# Patient Record
Sex: Female | Born: 1985 | Race: Black or African American | Hispanic: No | Marital: Single | Smoking: Former smoker
Health system: Southern US, Community
[De-identification: ages and names within clinical notes are randomized; demographics above are authoritative.]

## PROBLEM LIST (undated history)

## (undated) ENCOUNTER — Inpatient Hospital Stay (HOSPITAL_COMMUNITY): Payer: Self-pay

## (undated) DIAGNOSIS — Z8619 Personal history of other infectious and parasitic diseases: Secondary | ICD-10-CM

## (undated) DIAGNOSIS — Z3490 Encounter for supervision of normal pregnancy, unspecified, unspecified trimester: Secondary | ICD-10-CM

## (undated) HISTORY — DX: Encounter for supervision of normal pregnancy, unspecified, unspecified trimester: Z34.90

## (undated) HISTORY — DX: Personal history of other infectious and parasitic diseases: Z86.19

## (undated) HISTORY — PX: CHOLECYSTECTOMY: SHX55

---
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
## 2006-07-19 ENCOUNTER — Emergency Department (HOSPITAL_COMMUNITY): Admission: EM | Admit: 2006-07-19 | Discharge: 2006-07-19 | Payer: Self-pay | Admitting: Emergency Medicine

## 2006-09-12 ENCOUNTER — Emergency Department (HOSPITAL_COMMUNITY): Admission: EM | Admit: 2006-09-12 | Discharge: 2006-09-12 | Payer: Self-pay | Admitting: Emergency Medicine

## 2007-04-12 ENCOUNTER — Other Ambulatory Visit: Admission: RE | Admit: 2007-04-12 | Discharge: 2007-04-12 | Payer: Self-pay | Admitting: Obstetrics and Gynecology

## 2007-05-27 ENCOUNTER — Ambulatory Visit: Payer: Self-pay | Admitting: Obstetrics and Gynecology

## 2007-05-27 ENCOUNTER — Inpatient Hospital Stay (HOSPITAL_COMMUNITY): Admission: AD | Admit: 2007-05-27 | Discharge: 2007-05-29 | Payer: Self-pay | Admitting: Obstetrics and Gynecology

## 2007-11-02 ENCOUNTER — Emergency Department (HOSPITAL_COMMUNITY): Admission: EM | Admit: 2007-11-02 | Discharge: 2007-11-02 | Payer: Self-pay | Admitting: Emergency Medicine

## 2009-09-17 ENCOUNTER — Ambulatory Visit: Payer: Self-pay | Admitting: Advanced Practice Midwife

## 2009-09-17 ENCOUNTER — Inpatient Hospital Stay (HOSPITAL_COMMUNITY): Admission: AD | Admit: 2009-09-17 | Discharge: 2009-09-17 | Payer: Self-pay | Admitting: Obstetrics & Gynecology

## 2009-09-17 IMAGING — US US OB DETAIL+14 WK
1 series · 14 of 28 positions shown · non-contrast
Comparison: none

OBSTETRICAL ULTRASOUND:
 This ultrasound exam was performed in the [HOSPITAL] Ultrasound Department.  The OB US report was generated in the AS system, and faxed to the ordering physician.  This report is also available in [HOSPITAL]?s [employer] and in [REDACTED] PACS.

[Series 1: us ob comp +14 wk · 0.27mm/px · 14 of 64 slices shown]
[im 3/64]
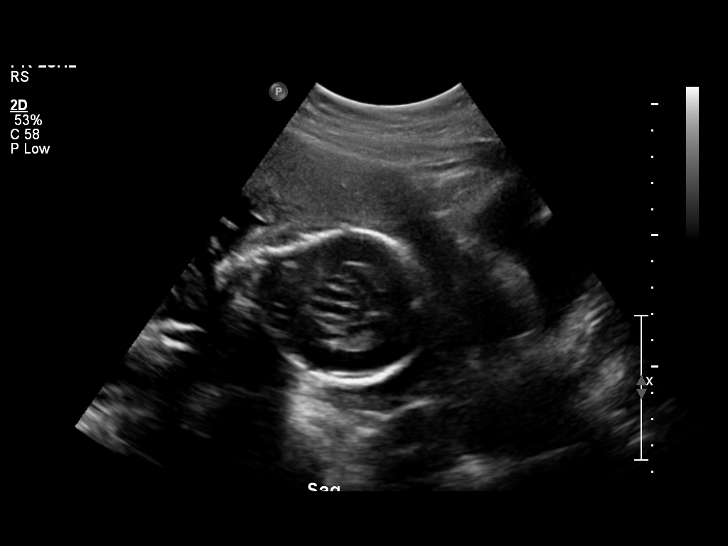
[im 8/64]
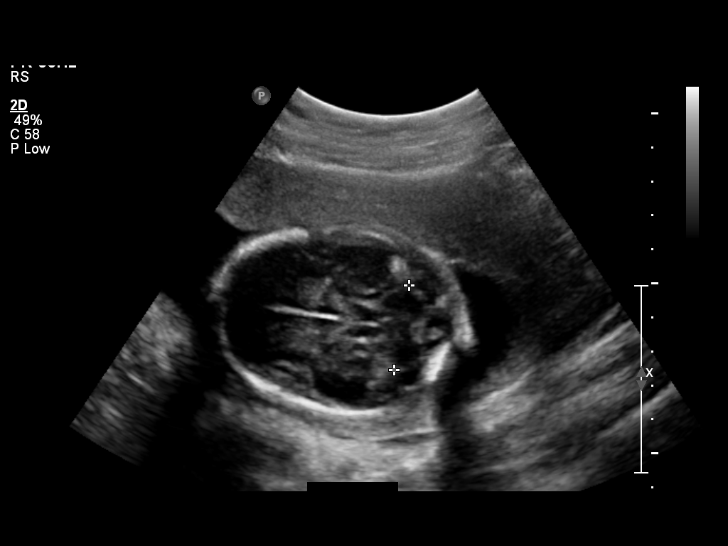
[im 12/64]
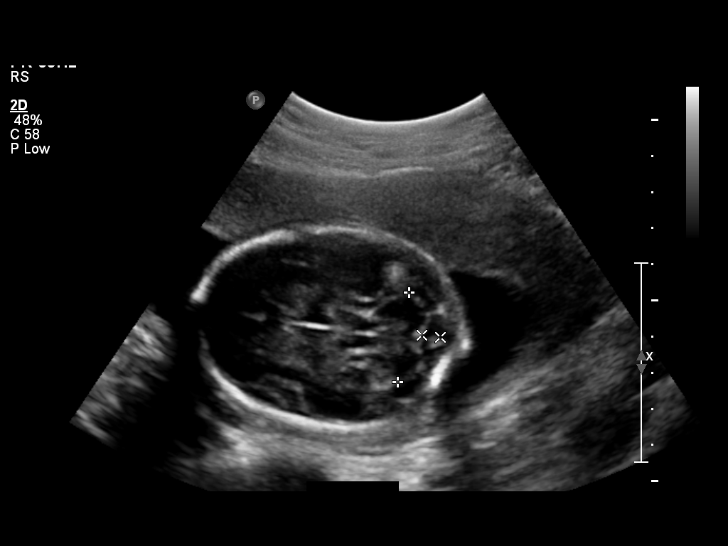
[im 17/64]
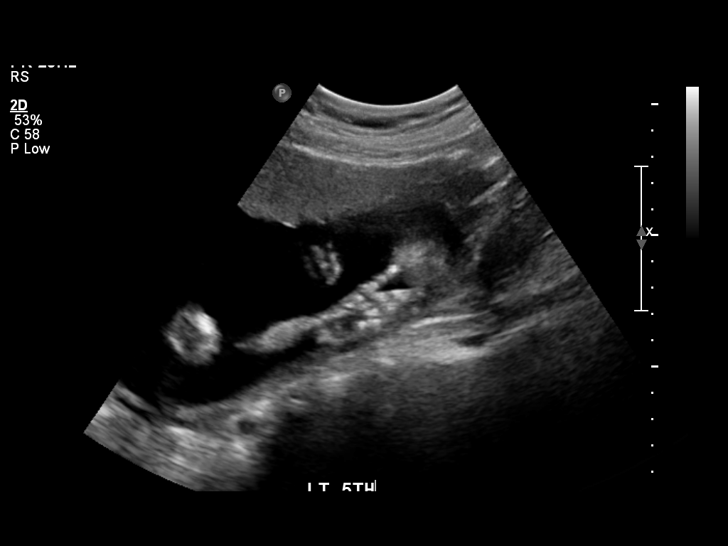
[im 22/64]
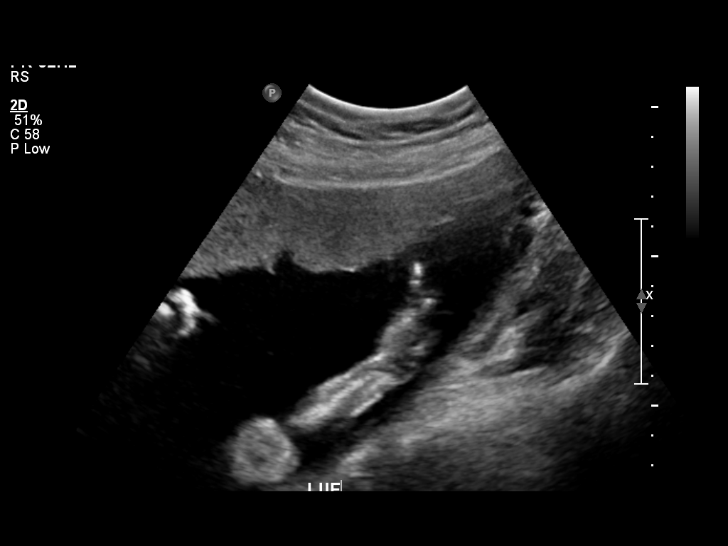
[im 26/64]
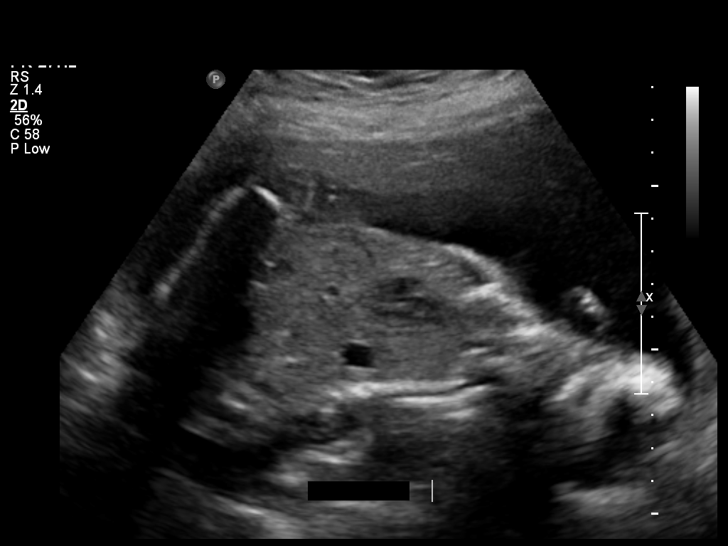
[im 31/64]
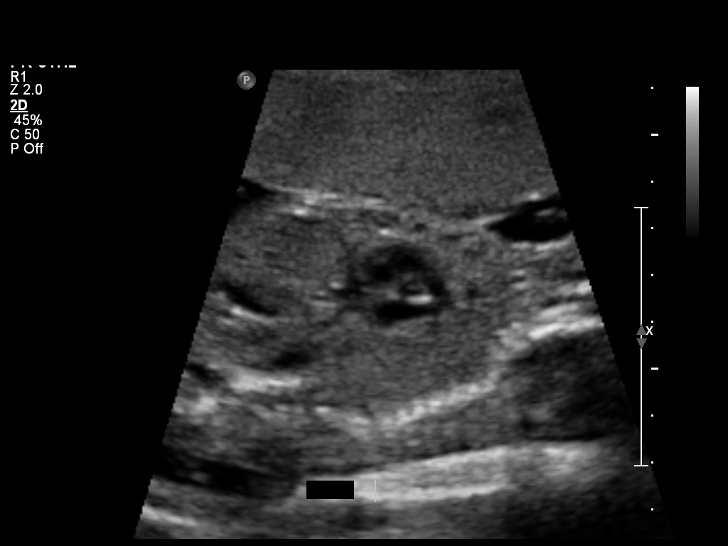
[im 36/64]
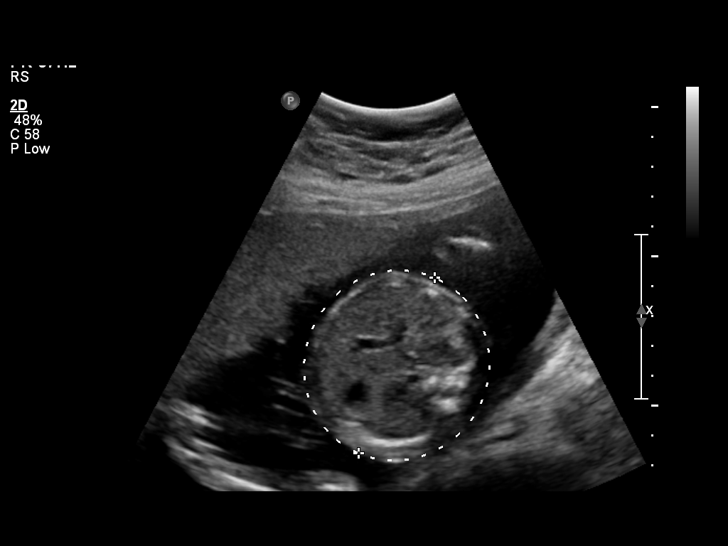
[im 40/64]
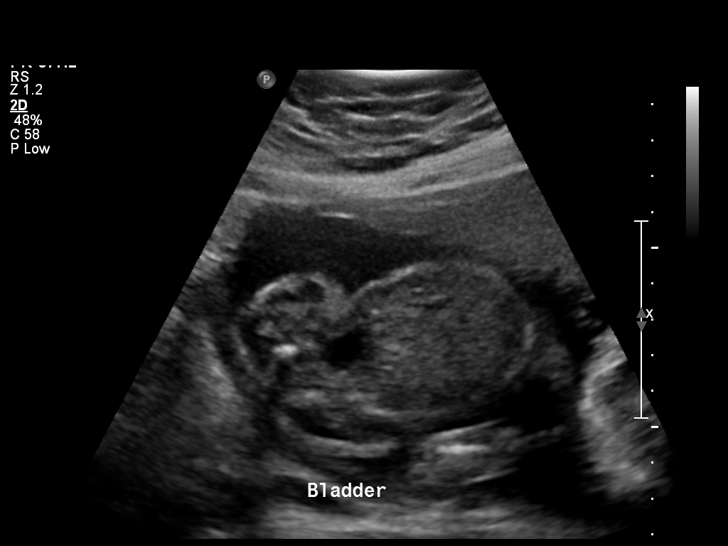
[im 45/64]
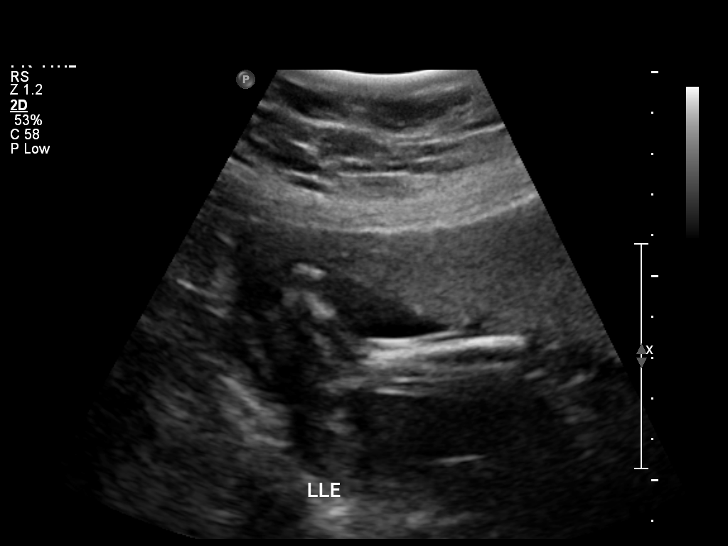
[im 50/64]
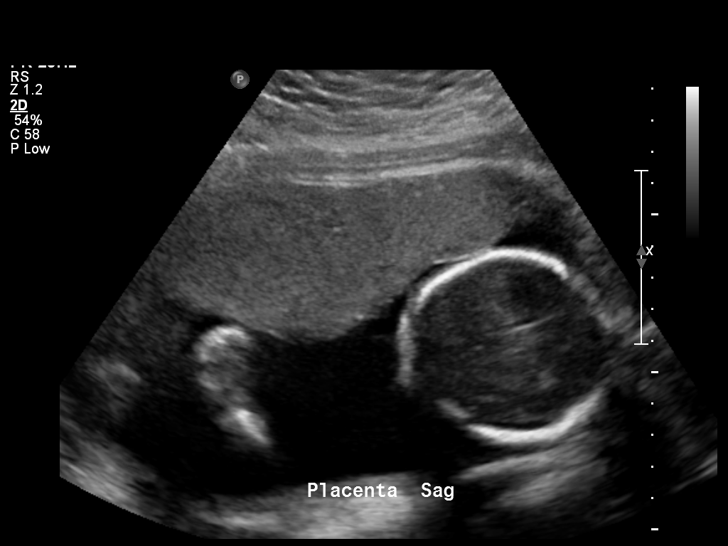
[im 54/64]
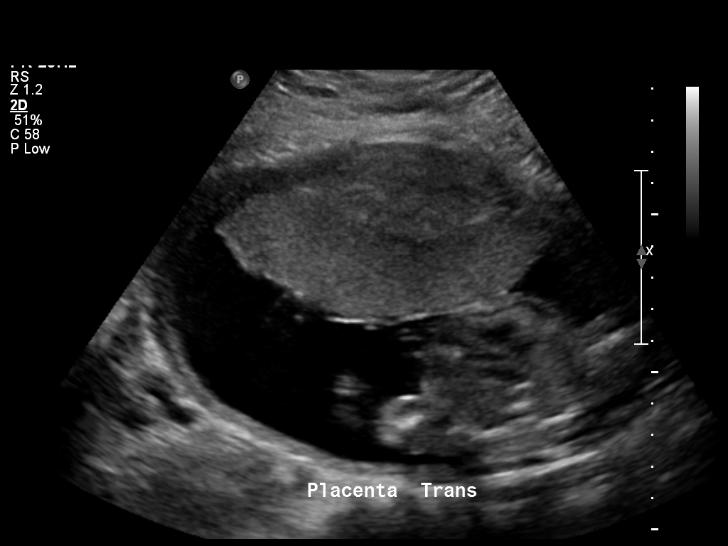
[im 59/64]
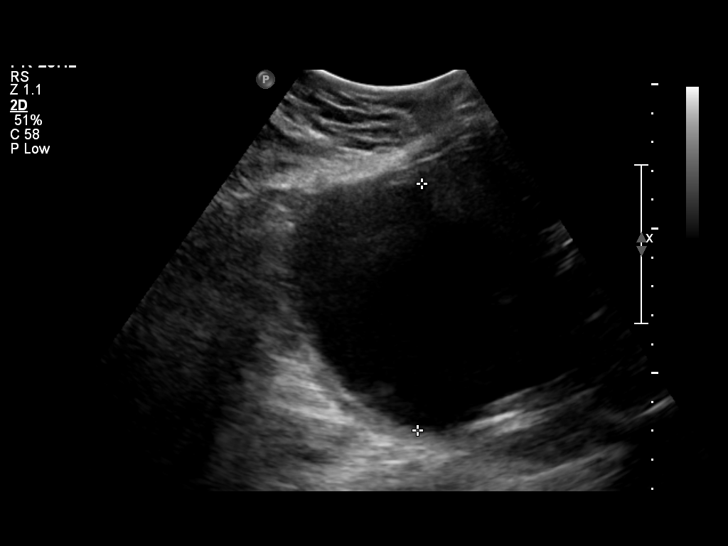
[im 64/64]
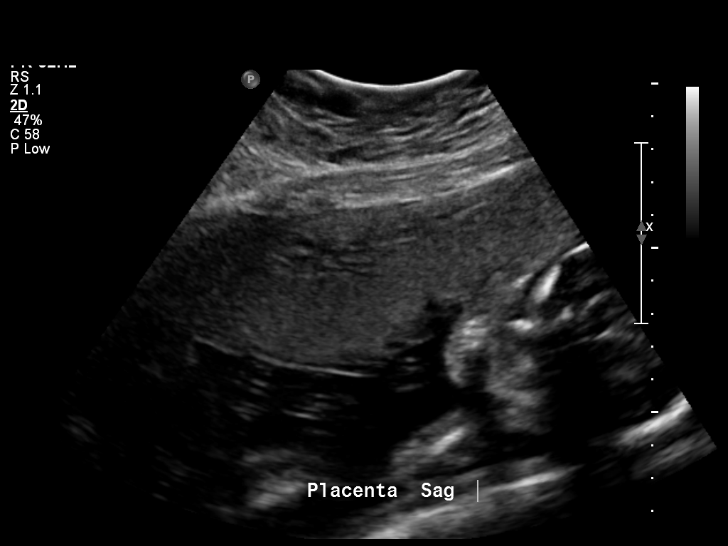

[14 of 28 positions shown; findings below may reference images not displayed]

IMPRESSION: See [facility] Obstetric US report.

## 2010-06-22 ENCOUNTER — Emergency Department (HOSPITAL_COMMUNITY)
Admission: EM | Admit: 2010-06-22 | Discharge: 2010-06-22 | Disposition: A | Discharge status: Home / Self Care | Payer: Self-pay | Attending: Emergency Medicine | Admitting: Emergency Medicine

## 2010-06-22 DIAGNOSIS — Z3201 Encounter for pregnancy test, result positive: Secondary | ICD-10-CM | POA: Insufficient documentation

## 2010-06-22 DIAGNOSIS — M545 Low back pain, unspecified: Secondary | ICD-10-CM | POA: Insufficient documentation

## 2010-06-22 LAB — URINALYSIS, ROUTINE W REFLEX MICROSCOPIC
Bilirubin Urine: NEGATIVE
Ketones, ur: 15 mg/dL — AB
Urine Glucose, Fasting: NEGATIVE mg/dL
Urobilinogen, UA: 0.2 mg/dL (ref 0.0–1.0)
pH: 5.5 (ref 5.0–8.0)

## 2010-06-22 LAB — URINE MICROSCOPIC-ADD ON

## 2010-06-22 LAB — BASIC METABOLIC PANEL
BUN: 6 mg/dL (ref 6–23)
Calcium: 9.2 mg/dL (ref 8.4–10.5)
Creatinine, Ser: 0.53 mg/dL (ref 0.4–1.2)
GFR calc Af Amer: >60 mL/min (ref >60)
Potassium: 4 mEq/L (ref 3.5–5.1)
Sodium: 137 mEq/L (ref 135–145)

## 2010-06-22 LAB — CBC
Platelets: 288 10*3/uL (ref 150–400)
WBC: 8.9 10*3/uL (ref 4.0–10.5)

## 2010-06-22 LAB — DIFFERENTIAL
Lymphocytes Relative: 4 % — ABNORMAL LOW (ref 12–46)
Lymphs Abs: 0.4 10*3/uL — ABNORMAL LOW (ref 0.7–4.0)
Monocytes Absolute: 0.3 10*3/uL (ref 0.1–1.0)
Neutro Abs: 8.2 10*3/uL — ABNORMAL HIGH (ref 1.7–7.7)
Neutrophils Relative %: 92 % — ABNORMAL HIGH (ref 43–77)

## 2010-06-22 LAB — HEPATIC FUNCTION PANEL
ALT: 12 U/L (ref 0–35)
AST: 15 U/L (ref 0–37)
Indirect Bilirubin: 0.4 mg/dL (ref 0.3–0.9)

## 2010-06-22 LAB — POCT PREGNANCY, URINE: Preg Test, Ur: POSITIVE

## 2010-06-22 LAB — HCG, QUANTITATIVE, PREGNANCY: hCG, Beta Chain, Quant, S: 112711 m[IU]/mL — ABNORMAL HIGH (ref <5)

## 2010-06-22 LAB — LIPASE, BLOOD: Lipase: 22 U/L (ref 11–59)

## 2010-07-19 LAB — WET PREP, GENITAL: Yeast Wet Prep HPF POC: NONE SEEN

## 2010-07-19 LAB — GC/CHLAMYDIA PROBE AMP, GENITAL: GC Probe Amp, Genital: POSITIVE — AB

## 2010-07-30 ENCOUNTER — Other Ambulatory Visit: Payer: Self-pay | Admitting: Obstetrics & Gynecology

## 2010-07-30 ENCOUNTER — Other Ambulatory Visit (HOSPITAL_COMMUNITY)
Admission: RE | Admit: 2010-07-30 | Discharge: 2010-07-30 | Disposition: A | Discharge status: Home / Self Care | Payer: Medicaid Other | Source: Ambulatory Visit | Attending: Obstetrics and Gynecology | Admitting: Obstetrics and Gynecology

## 2010-07-30 DIAGNOSIS — R87810 Cervical high risk human papillomavirus (HPV) DNA test positive: Secondary | ICD-10-CM | POA: Insufficient documentation

## 2010-07-30 DIAGNOSIS — Z113 Encounter for screening for infections with a predominantly sexual mode of transmission: Secondary | ICD-10-CM | POA: Insufficient documentation

## 2010-07-30 DIAGNOSIS — Z01419 Encounter for gynecological examination (general) (routine) without abnormal findings: Secondary | ICD-10-CM | POA: Insufficient documentation

## 2010-09-17 NOTE — Discharge Summary (Signed)
NAMEJAKERRA, Annette Zhang                    ACCOUNT NO.:  [id number]   MEDICAL RECORD NO.:  [id number]                   PATIENT TYPE:  OIB   LOCATION:  A428                                 FACILITY:  [facility]   PHYSICIAN:  Langley Gauss, M.D.                DATE OF BIRTH:  03-Apr-1986   DATE OF ADMISSION:  09/13/2003  DATE OF DISCHARGE:  09/14/2003                                 DISCHARGE SUMMARY   The patient was seen for evaluation on both dates of service.   FINAL DIAGNOSES:  1. A 25-week intrauterine pregnancy.  2. Second-trimester bleeding.  3. Right periurethral laceration resulting in #2.   DISPOSITION AT TIME OF DISCHARGE:  The patient is having very minimal active  bleeding. She does have a little bit of pink staining into the toilet with  voiding only. She is having no passage of any clots, but does complain of  some vaginal stinging. She is advised to limit activity as much as possible.  She will continue prenatal care with [facility] and will contact her  office for a sooner visit if necessary for renewed bleeding. The patient  does decline any suturing of this 3 cm long spontaneous laceration at this  time.   CURRENT LABORATORY STUDIES:  Admission hemoglobin and hematocrit 11.1/32.0,  white count 13.9, platelet count 289,000. Fibrinogen 582. D-dimer slightly  elevated at 3.94. AB positive blood type. Negative antibody screen.   HOSPITAL COURSE:  An 25 year old, gravida 1, para 0, [redacted] weeks gestation  presents to [facility] at 21:00 on Sep 13, 2003, with  a chief  complaint of bleeding. The patient denies sexual relations, abdominal pain,  spontaneous rupture of membranes, or any trauma. Specifically, the patient  has had an uncomplicated prenatal course to date. During the afternoon on  Sep 13, 2003, she had some limited activity outdoors with some light-hearted  wrestling with some other children, had also taken her large dogs for some  walks, and  subsequently with voiding the patient states that she had the  onset of some bright red bleeding initially noticed in the toilet water and  was sufficient enough that a small amount did run down her leg. She also  describes the onset of vaginal stinging at that time and the bleeding was  likewise noted when she wiped. She denies any additional leakage of fluid.  She denies any pelvic cramping. Her prenatal course otherwise to date has  been uncomplicated. She is noted to have had an ultrasound performed at  [facility] at 20-weeks gestation. At that time the placenta was  noted to be anterior and mid in position with no low lying or placenta  previa identified.  She is also noted to be RH positive blood type, initial  GC and Chlamydia cultures are negative.   PAST MEDICAL HISTORY:  No significant medical history and no surgical  history. She has no known drug allergies.  CURRENT MEDICATIONS:  CenogenUltra only.   FAMILY HISTORY:  Noncontributory.   SOCIAL HISTORY:  The patient lives with her aunt. The father of the baby is  noted to be supportive. The patient was noted to smoke one-third of a pack  per day of cigarettes. She quit during the pregnancy. She denies any alcohol  or recreational drug usage.   PRENATAL COURSE:  In addition, AFP triple screen was within normal limits.  RPR nonreactive. Pap smear within normal limits.   PERTINENT REVIEW OF SYSTEMS:  See HPI.   PHYSICAL EXAMINATION:  GENERAL: The patient is noted to be in no acute  distress. No evidence of any active bleeding occurring.  VITAL SIGNS: Temperature 98.6, pulse 126, respiratory rate 20, blood  pressure 116/62.  HEENT: Negative. No adenopathy. Mucous membranes are moist.  NECK: Supple.  LUNGS: Clear.  CARDIOVASCULAR: Regular rate and rhythm.  ABDOMEN: Soft and nontender. No surgical scars are identified. A gravid  uterus is identified with a fundal height of 26 cm. No uterine tenderness  elicited.  Normal uterine tone palpable.  EXTREMITIES: Normal.  PELVIC EXAM: Normal external genitalia. No obvious bleeding identified. No  leakage of fluid. No evidence of any vaginal discharge.   A sterile speculum examination is performed. Initially upon separating the  vulva for introital entry, there is noted to be a 3-cm long, though  superficial right periurethral laceration with a small clot adherent, and  obvious evidence of active bleeding with gentle separation of the labia that  like separates the mucosal edges of the laceration. Beyond this, the sterile  speculum examination reveals the cervix to be closed. There is no cervical  bleeding. No blood within the vaginal vault. The remainder of the vaginal  vault and vulva within normal limits.   ADDITIONAL STUDIES:  A nonstress test is in progress with the indication of  second trimester vaginal bleeding, fetal  heart tone baseline 150, no fetal  heart rate accelerations or decelerations noted with long-term variability  for [redacted] weeks gestation noted to be appropriate and normal, thus a  nonreactive nonstress test, but reassuring considering the gestational age.   HOSPITAL COURSE:  The patient was observed throughout the evening and  through the next day. She had normoactive bleeding, no bleeding into the  undergarments. With voiding she does have a small amount of blood going into  the toilet water standing in a very light red color. Again, she denies any  significant uterine cramping, denies pelvic pressure, and denies any change  at all in her vaginal discharge.   FINAL DIAGNOSIS:  Second-trimester vaginal bleeding secondary to spontaneous  right periurethral laceration. Again, the patient declined suturing of this  area at this time. She is, however, advised that should this continue to be  an ongoing problem she may be best served by proceeding with closure, if secondary closure does not spontaneously occur.      ___________________________________________                                         Langley Gauss, M.D.   DC/MEDQ  D:  09/14/2003  T:  09/14/2003  Job:  [id number]

## 2010-12-02 ENCOUNTER — Encounter: Payer: Self-pay | Admitting: *Deleted

## 2010-12-02 ENCOUNTER — Emergency Department (HOSPITAL_COMMUNITY)
Admission: EM | Admit: 2010-12-02 | Discharge: 2010-12-02 | Disposition: A | Discharge status: Home / Self Care | Payer: Medicaid Other | Attending: Emergency Medicine | Admitting: Emergency Medicine

## 2010-12-02 DIAGNOSIS — O99891 Other specified diseases and conditions complicating pregnancy: Secondary | ICD-10-CM | POA: Insufficient documentation

## 2010-12-02 DIAGNOSIS — N898 Other specified noninflammatory disorders of vagina: Secondary | ICD-10-CM | POA: Insufficient documentation

## 2010-12-02 NOTE — ED Notes (Signed)
Pt stated she is [redacted] weeks pregnant, felt fluid coming out of vagina while sitting on sofa apporx 15 min pta.  Was clear fluid.  5th pregnancy.  Denies any pain/contractions, is followed by dr. Emelda Fear.   fht's 168, denies any urinary s/s

## 2010-12-02 NOTE — ED Provider Notes (Signed)
History    Scribed for Joya Gaskins, MD, the patient was seen in room [facility]01/[facility]. This chart was scribed by Clarita Crane. This patient's care was started at 9:47PM.  CSN: [id number] Arrival date & time: 12/02/2010  9:34 PM  Chief Complaint  Patient presents with  . Vaginal Discharge   HPI Patient is a 25 year old female currently 33.[redacted] weeks pregnant, G-5 P-4 Ab-0, c/o moderate amount of clear vaginal d/c which "gushed out" from vagina with associated vaginal pressure approximately 30 minutes ago while sitting in a chair. Denies vaginal bleeding, abdominal pain, contractions, HA, dizziness, swelling of extremities, chest pain, dysuria. Patient reports she has had no complications with current pregnancy or previous pregnancies. Notes she can feel fetal movement currently.   PAST MEDICAL HISTORY:  Past Medical History  Diagnosis Date  . Fifth pregnancy     PAST SURGICAL HISTORY:  History reviewed. No pertinent past surgical history.  MEDICATIONS:  Previous Medications   No medications on file     ALLERGIES:  Allergies as of 12/02/2010  . (No Known Allergies)     FAMILY HISTORY:  History reviewed. No pertinent family history.   SOCIAL HISTORY: History   Social History  . Marital Status: Single    Spouse Name: N/A    Number of Children: N/A  . Years of Education: N/A   Social History Main Topics  . Smoking status: Never Smoker   . Smokeless tobacco: None  . Alcohol Use: No  . Drug Use: No  . Sexually Active:    Other Topics Concern  . None   Social History Narrative  . None     OB History    Grav Para Term Preterm Abortions TAB SAB Ect Mult Living   5 4 4              Review of Systems 10 Systems reviewed and are negative for acute change except as noted in the HPI.  Physical Exam  Ht 5\' 4"  (1.626 m)  Wt 263 lb (119.296 kg)  BMI 45.14 kg/m2  Physical Exam CONSTITUTIONAL: Well developed/well nourished HEAD AND FACE:  Normocephalic/atraumatic EYES: EOMI/PERRL ENMT: Mucous membranes moist NECK: supple no meningeal signs CV: S1/S2 noted, no murmurs/rubs/gallops noted LUNGS: Lungs are clear to auscultation bilaterally, no apparent distress ABDOMEN: soft, nontender, no rebound or guarding, gravid, size c/w 33.5 weeks GU: Pelvic exam-Sterile gloves used, female chaperone present, no vag bleeding, whitish vag d/c with no obvious pooling of fluid, cervix fingertip dilated NEURO: Pt is awake/alert, moves all extremitiesx4 EXTREMITIES: pulses normal, full ROM SKIN: warm, color normal PSYCH: no abnormalities of mood noted  ED Course  Procedures  OTHER DATA REVIEWED: Nursing notes, vital signs reviewed.   BP 108/65  Pulse 117  Temp(Src) 97.5 F (36.4 C) (Oral)  Resp 20  Ht 5\' 4"  (1.626 m)  Wt 263 lb (119.296 kg)  BMI 45.14 kg/m2  SpO2 98%    LABS / RADIOLOGY:  No results found.   PROCEDURES:  ED COURSE / COORDINATION OF CARE: 9:49PM- Patient currently on fetal monitor. Pelvic exam recommended to patient and consent provided by patient to have exam performed. 9:50PM- Pelvic exam performed  9:55Pm-Fetal heart tones in 140 consistently, Patient BP-108/55 10:00PM- Consult completed with Dr. Emelda Fear. Patient's case discussed and Dr. Emelda Fear agrees to evaluated patient in ED.   Seen by ferguson.  He performed exam, and she is fern negative.  No evidence of ROM.  Pt stable for d/c after seen by dr Emelda Fear  CONDITION ON DISCHARGE: stable   MEDICATIONS GIVEN IN THE E.D. Medications - No data to display    I personally performed the services described in this documentation, which was scribed in my presence. The recorded information has been reviewed and considered. Joya Gaskins, MD   Joya Gaskins, MD 12/02/10 [phone]

## 2010-12-02 NOTE — Progress Notes (Signed)
12/02/10 2156  Provider Notification  Provider Name/Title Other (comment) (Dr Bebe Shaggy, [facility] EDP)  Method of Notification Phone  Request Evaluate - remote  Notification Reason Other (Comment)   spoke to dr Bebe Shaggy and [facility] at [facility] ED, pt is 33.4 c/o white liquid discharge approx 1 hour ago. FHR tracing reassuring, pt is G5P4 seen at [facility]. EDP to call on call for [facility]. Pt in no acute distress per report on phine

## 2010-12-02 NOTE — ED Notes (Signed)
Clear vaginal discharge that stared today, pt states that she noticed the discharge when she was sitting in a chair, states that she felt "something gush out", pt admits to vaginal pressure, pt states that she will be [redacted] weeks pregnant Sunday, due date 01/16/2011, pt's fifth pregnancy, pt states that the pregnancy is normal,  Pt states that she was induced with two pregnancies due to being term, the other two pregnancies were normal deliveries at 9 months,

## 2010-12-02 NOTE — ED Notes (Signed)
Dr.ferguson in to exam pt, pt laughing and smiling at times, slide obtained by dr.ferguson. To lab to exam under microscope.  Pt tolerated well.  Pt requested that brother be allowed into exam room while she is dressing. Brother to bedside. Per pt request.  She was removed from all monitors

## 2011-01-21 LAB — RAPID URINE DRUG SCREEN, HOSP PERFORMED
Barbiturates: NOT DETECTED
Benzodiazepines: NOT DETECTED
Cocaine: NOT DETECTED
Opiates: NOT DETECTED
Tetrahydrocannabinol: NOT DETECTED

## 2011-01-21 LAB — CBC
HCT: 27.1 — ABNORMAL LOW
Hemoglobin: 9.1 — ABNORMAL LOW
MCV: 83.1
WBC: 11.8 — ABNORMAL HIGH

## 2011-01-21 LAB — RPR: RPR Ser Ql: NONREACTIVE

## 2012-10-18 ENCOUNTER — Other Ambulatory Visit: Payer: Self-pay | Admitting: Obstetrics & Gynecology

## 2012-10-18 DIAGNOSIS — O3680X0 Pregnancy with inconclusive fetal viability, not applicable or unspecified: Secondary | ICD-10-CM

## 2012-10-29 ENCOUNTER — Other Ambulatory Visit: Payer: Self-pay

## 2012-10-29 ENCOUNTER — Encounter (HOSPITAL_COMMUNITY): Payer: Self-pay | Admitting: *Deleted

## 2012-10-29 ENCOUNTER — Encounter: Payer: Self-pay | Admitting: *Deleted

## 2012-10-29 ENCOUNTER — Emergency Department (HOSPITAL_COMMUNITY)
Admission: EM | Admit: 2012-10-29 | Discharge: 2012-10-29 | Discharge status: Against Medical Advice | Payer: Self-pay | Attending: Emergency Medicine | Admitting: Emergency Medicine

## 2012-10-29 DIAGNOSIS — R4020 Unspecified coma: Secondary | ICD-10-CM

## 2012-10-29 DIAGNOSIS — O9989 Other specified diseases and conditions complicating pregnancy, childbirth and the puerperium: Secondary | ICD-10-CM | POA: Insufficient documentation

## 2012-10-29 DIAGNOSIS — R55 Syncope and collapse: Secondary | ICD-10-CM | POA: Insufficient documentation

## 2012-10-29 DIAGNOSIS — R404 Transient alteration of awareness: Secondary | ICD-10-CM | POA: Insufficient documentation

## 2012-10-29 LAB — URINALYSIS, ROUTINE W REFLEX MICROSCOPIC
Hgb urine dipstick: NEGATIVE
Ketones, ur: NEGATIVE mg/dL
Nitrite: NEGATIVE
Protein, ur: NEGATIVE mg/dL
Specific Gravity, Urine: 1.02 (ref 1.005–1.030)

## 2012-10-29 LAB — POCT PREGNANCY, URINE: Preg Test, Ur: POSITIVE — AB

## 2012-10-29 NOTE — ED Notes (Signed)
Pt left before being seen and after triage. Pt expressed her concerns of being pregnant while fiance is deployed [place] seas as a marine. Pt is visiting her father here from [place].

## 2012-10-29 NOTE — ED Notes (Addendum)
Pt recently moved here from [place],  Having episodes of syncope, since 6/8 and having vomiting and hot flashes.  Alert, talking.  Pt took preg test at home 1 week ago that was negative.

## 2012-10-29 NOTE — ED Notes (Signed)
Pt ambulating to bathroom without assistance

## 2012-10-30 ENCOUNTER — Encounter: Payer: Self-pay | Admitting: *Deleted

## 2012-10-30 ENCOUNTER — Other Ambulatory Visit: Payer: Self-pay

## 2012-11-08 ENCOUNTER — Ambulatory Visit (INDEPENDENT_AMBULATORY_CARE_PROVIDER_SITE_OTHER): Payer: Medicaid Other

## 2012-11-08 DIAGNOSIS — O3680X0 Pregnancy with inconclusive fetal viability, not applicable or unspecified: Secondary | ICD-10-CM

## 2012-11-08 NOTE — Progress Notes (Signed)
U/S (12+2wks)-single IUP with +FCA noted, CRL c/w LMP dates, cx long and closed, bilateral adnexa WNL

## 2012-11-21 ENCOUNTER — Other Ambulatory Visit: Payer: Self-pay

## 2013-01-01 ENCOUNTER — Encounter: Payer: Medicaid Other | Admitting: Obstetrics & Gynecology

## 2013-01-10 ENCOUNTER — Encounter: Payer: Self-pay | Admitting: Adult Health

## 2013-01-10 ENCOUNTER — Other Ambulatory Visit: Payer: Self-pay | Admitting: Adult Health

## 2013-01-10 ENCOUNTER — Ambulatory Visit (INDEPENDENT_AMBULATORY_CARE_PROVIDER_SITE_OTHER): Payer: Medicaid Other | Admitting: Adult Health

## 2013-01-10 VITALS — BP 120/76 | Wt 265.0 lb

## 2013-01-10 DIAGNOSIS — Z331 Pregnant state, incidental: Secondary | ICD-10-CM

## 2013-01-10 DIAGNOSIS — O0930 Supervision of pregnancy with insufficient antenatal care, unspecified trimester: Secondary | ICD-10-CM

## 2013-01-10 DIAGNOSIS — Z1389 Encounter for screening for other disorder: Secondary | ICD-10-CM

## 2013-01-10 DIAGNOSIS — Z3490 Encounter for supervision of normal pregnancy, unspecified, unspecified trimester: Secondary | ICD-10-CM

## 2013-01-10 DIAGNOSIS — Z3482 Encounter for supervision of other normal pregnancy, second trimester: Secondary | ICD-10-CM

## 2013-01-10 HISTORY — DX: Encounter for supervision of normal pregnancy, unspecified, unspecified trimester: Z34.90

## 2013-01-10 LAB — POCT URINALYSIS DIPSTICK: Nitrite, UA: NEGATIVE

## 2013-01-10 LAB — CBC
HCT: 32.3 % — ABNORMAL LOW (ref 36.0–46.0)
Hemoglobin: 10.7 g/dL — ABNORMAL LOW (ref 12.0–15.0)
MCH: 30.1 pg (ref 26.0–34.0)
MCHC: 33.1 g/dL (ref 30.0–36.0)
RDW: 13.2 % (ref 11.5–15.5)

## 2013-01-10 LAB — URINALYSIS
Bilirubin Urine: NEGATIVE
Leukocytes, UA: NEGATIVE
Nitrite: NEGATIVE
Protein, ur: NEGATIVE mg/dL
Specific Gravity, Urine: 1.018 (ref 1.005–1.030)
Urobilinogen, UA: 1 mg/dL (ref 0.0–1.0)

## 2013-01-10 MED ORDER — PRENATAL PLUS 27-1 MG PO TABS: 1.0000 | ORAL_TABLET | Freq: Every day | ORAL | Status: AC

## 2013-01-10 NOTE — Progress Notes (Signed)
Pt here today for New OB appointment. Pt given CCNC form and lab consent to read and sign. Pt denies any problems or concerns at this time.

## 2013-01-10 NOTE — Patient Instructions (Addendum)
Pregnancy - Second Trimester The second trimester of pregnancy (3 to 6 months) is a period of rapid growth for you and your baby. At the end of the sixth month, your baby is about 9 inches long and weighs 1 1/2 pounds. You will begin to feel the baby move between 18 and 20 weeks of the pregnancy. This is called quickening. Weight gain is faster. A clear fluid (colostrum) may leak out of your breasts. You may feel small contractions of the womb (uterus). This is known as false labor or Braxton-Hicks contractions. This is like a practice for labor when the baby is ready to be born. Usually, the problems with morning sickness have usually passed by the end of your first trimester. Some women develop small dark blotches (called cholasma, mask of pregnancy) on their face that usually goes away after the baby is born. Exposure to the sun makes the blotches worse. Acne may also develop in some pregnant women and pregnant women who have acne, may find that it goes away. PRENATAL EXAMS  Blood work may continue to be done during prenatal exams. These tests are done to check on your health and the probable health of your baby. Blood work is used to follow your blood levels (hemoglobin). Anemia (low hemoglobin) is common during pregnancy. Iron and vitamins are given to help prevent this. You will also be checked for diabetes between 24 and 28 weeks of the pregnancy. Some of the previous blood tests may be repeated.  The size of the uterus is measured during each visit. This is to make sure that the baby is continuing to grow properly according to the dates of the pregnancy.  Your blood pressure is checked every prenatal visit. This is to make sure you are not getting toxemia.  Your urine is checked to make sure you do not have an infection, diabetes or protein in the urine.  Your weight is checked often to make sure gains are happening at the suggested rate. This is to ensure that both you and your baby are growing  normally.  Sometimes, an ultrasound is performed to confirm the proper growth and development of the baby. This is a test which bounces harmless sound waves off the baby so your caregiver can more accurately determine due dates. Sometimes, a test is done on the amniotic fluid surrounding the baby. This test is called an amniocentesis. The amniotic fluid is obtained by sticking a needle into the belly (abdomen). This is done to check the chromosomes in instances where there is a concern about possible genetic problems with the baby. It is also sometimes done near the end of pregnancy if an early delivery is required. In this case, it is done to help make sure the baby's lungs are mature enough for the baby to live outside of the womb. CHANGES OCCURING IN THE SECOND TRIMESTER OF PREGNANCY Your body goes through many changes during pregnancy. They vary from person to person. Talk to your caregiver about changes you notice that you are concerned about.  During the second trimester, you will likely have an increase in your appetite. It is normal to have cravings for certain foods. This varies from person to person and pregnancy to pregnancy.  Your lower abdomen will begin to bulge.  You may have to urinate more often because the uterus and baby are pressing on your bladder. It is also common to get more bladder infections during pregnancy. You can help this by drinking lots of fluids  and emptying your bladder before and after intercourse.  You may begin to get stretch marks on your hips, abdomen, and breasts. These are normal changes in the body during pregnancy. There are no exercises or medicines to take that prevent this change.  You may begin to develop swollen and bulging veins (varicose veins) in your legs. Wearing support hose, elevating your feet for 15 minutes, 3 to 4 times a day and limiting salt in your diet helps lessen the problem.  Heartburn may develop as the uterus grows and pushes up  against the stomach. Antacids recommended by your caregiver helps with this problem. Also, eating smaller meals 4 to 5 times a day helps.  Constipation can be treated with a stool softener or adding bulk to your diet. Drinking lots of fluids, and eating vegetables, fruits, and whole grains are helpful.  Exercising is also helpful. If you have been very active up until your pregnancy, most of these activities can be continued during your pregnancy. If you have been less active, it is helpful to start an exercise program such as walking.  Hemorrhoids may develop at the end of the second trimester. Warm sitz baths and hemorrhoid cream recommended by your caregiver helps hemorrhoid problems.  Backaches may develop during this time of your pregnancy. Avoid heavy lifting, wear low heal shoes, and practice good posture to help with backache problems.  Some pregnant women develop tingling and numbness of their hand and fingers because of swelling and tightening of ligaments in the wrist (carpel tunnel syndrome). This goes away after the baby is born.  As your breasts enlarge, you may have to get a bigger bra. Get a comfortable, cotton, support bra. Do not get a nursing bra until the last month of the pregnancy if you will be nursing the baby.  You may get a dark line from your belly button to the pubic area called the linea nigra.  You may develop rosy cheeks because of increase blood flow to the face.  You may develop spider looking lines of the face, neck, arms, and chest. These go away after the baby is born. HOME CARE INSTRUCTIONS   It is extremely important to avoid all smoking, herbs, alcohol, and unprescribed drugs during your pregnancy. These chemicals affect the formation and growth of the baby. Avoid these chemicals throughout the pregnancy to ensure the delivery of a healthy infant.  Most of your home care instructions are the same as suggested for the first trimester of your pregnancy.  Keep your caregiver's appointments. Follow your caregiver's instructions regarding medicine use, exercise, and diet.  During pregnancy, you are providing food for you and your baby. Continue to eat regular, well-balanced meals. Choose foods such as meat, fish, milk and other low fat dairy products, vegetables, fruits, and whole-grain breads and cereals. Your caregiver will tell you of the ideal weight gain.  A physical sexual relationship may be continued up until near the end of pregnancy if there are no other problems. Problems could include early (premature) leaking of amniotic fluid from the membranes, vaginal bleeding, abdominal pain, or other medical or pregnancy problems.  Exercise regularly if there are no restrictions. Check with your caregiver if you are unsure of the safety of some of your exercises. The greatest weight gain will occur in the last 2 trimesters of pregnancy. Exercise will help you:  Control your weight.  Get you in shape for labor and delivery.  Lose weight after you have the baby.  Wear  a good support or jogging bra for breast tenderness during pregnancy. This may help if worn during sleep. Pads or tissues may be used in the bra if you are leaking colostrum.  Do not use hot tubs, steam rooms or saunas throughout the pregnancy.  Wear your seat belt at all times when driving. This protects you and your baby if you are in an accident.  Avoid raw meat, uncooked cheese, cat litter boxes, and soil used by cats. These carry germs that can cause birth defects in the baby.  The second trimester is also a good time to visit your dentist for your dental health if this has not been done yet. Getting your teeth cleaned is okay. Use a soft toothbrush. Brush gently during pregnancy.  It is easier to leak urine during pregnancy. Tightening up and strengthening the pelvic muscles will help with this problem. Practice stopping your urination while you are going to the bathroom.  These are the same muscles you need to strengthen. It is also the muscles you would use as if you were trying to stop from passing gas. You can practice tightening these muscles up 10 times a set and repeating this about 3 times per day. Once you know what muscles to tighten up, do not perform these exercises during urination. It is more likely to contribute to an infection by backing up the urine.  Ask for help if you have financial, counseling, or nutritional needs during pregnancy. Your caregiver will be able to offer counseling for these needs as well as refer you for other special needs.  Your skin may become oily. If so, wash your face with mild soap, use non-greasy moisturizer and oil or cream based makeup. MEDICINES AND DRUG USE IN PREGNANCY  Take prenatal vitamins as directed. The vitamin should contain 1 milligram of folic acid. Keep all vitamins out of reach of children. Only a couple vitamins or tablets containing iron may be fatal to a baby or young child when ingested.  Avoid use of all medicines, including herbs, over-the-counter medicines, not prescribed or suggested by your caregiver. Only take over-the-counter or prescription medicines for pain, discomfort, or fever as directed by your caregiver. Do not use aspirin.  Let your caregiver also know about herbs you may be using.  Alcohol is related to a number of birth defects. This includes fetal alcohol syndrome. All alcohol, in any form, should be avoided completely. Smoking will cause low birth rate and premature babies.  Street or illegal drugs are very harmful to the baby. They are absolutely forbidden. A baby born to an addicted mother will be addicted at birth. The baby will go through the same withdrawal an adult does. SEEK MEDICAL CARE IF:  You have any concerns or worries during your pregnancy. It is better to call with your questions if you feel they cannot wait, rather than worry about them. SEEK IMMEDIATE MEDICAL CARE  IF:   An unexplained oral temperature above 102 F (38.9 C) develops, or as your caregiver suggests.  You have leaking of fluid from the vagina (birth canal). If leaking membranes are suspected, take your temperature and tell your caregiver of this when you call.  There is vaginal spotting, bleeding, or passing clots. Tell your caregiver of the amount and how many pads are used. Light spotting in pregnancy is common, especially following intercourse.  You develop a bad smelling vaginal discharge with a change in the color from clear to white.  You continue to feel  sick to your stomach (nauseated) and have no relief from remedies suggested. You vomit blood or coffee ground-like materials.  You lose more than 2 pounds of weight or gain more than 2 pounds of weight over 1 week, or as suggested by your caregiver.  You notice swelling of your face, hands, feet, or legs.  You get exposed to [place] measles and have never had them.  You are exposed to fifth disease or chickenpox.  You develop belly (abdominal) pain. Round ligament discomfort is a common non-cancerous (benign) cause of abdominal pain in pregnancy. Your caregiver still must evaluate you.  You develop a bad headache that does not go away.  You develop fever, diarrhea, pain with urination, or shortness of breath.  You develop visual problems, blurry, or double vision.  You fall or are in a car accident or any kind of trauma.  There is mental or physical violence at home. Document Released: 04/12/2001 Document Revised: 01/11/2012 Document Reviewed: 10/15/2008 [facility] Patient Information 2014 [place], [place]. Return in 1 week

## 2013-01-10 NOTE — Progress Notes (Signed)
  Subjective:    Annette Zhang is a 27 y.o. G27P0003 African American female at [redacted]w[redacted]d by LMP being seen today for her first obstetrical visit.  Her obstetrical history is significant for late presentation and putting bab up for adoption.  Pregnancy history fully reviewed.   Patient reports no complaints.  Filed Vitals:   01/10/13 1128  BP: 120/76  Weight: 265 lb (120.203 kg)    HISTORY: OB History  Gravida Para Term Preterm AB SAB TAB Ectopic Multiple Living  7 6 0      1 3    # Outcome Date GA Lbr Len/2nd Weight Sex Delivery Anes PTL Lv  7 CUR           6 PAR 2012    F SVD   Y  5 PAR 2011    M SVD     4 PAR 2009    M SVD     3A PAR 2008    M SVD     3B  2008    M      2 PAR 2007    M SVD   Y  1 PAR 2005    F SVD   Y     Past Medical History  Diagnosis Date  . Hx of chlamydia infection   . Pregnant 01/10/2013    To adopt baby out   Past Surgical History  Procedure Laterality Date  . Cholecystectomy     Family History  Problem Relation Age of Onset  . Cancer Maternal Aunt     breast  . Cancer Maternal Grandmother     breast and cervical     Exam    Pelvic Exam:    Perineum: deferred   Vulva: deferred   Vagina:  deferred   Uterus   21 week size     Cervix: deferred   Adnexa: Not palpable   Urinary: deferred    System:     Skin: normal coloration and turgor, no rashes    Neurologic: oriented, normal mood   Extremities: normal strength, tone, and muscle mass   HEENT PERRLA   Mouth/Teeth mucous membranes moist   Cardiovascular: regular rate and rhythm   Respiratory:  appears well, vitals normal, no respiratory distress, acyanotic, normal RR   Abdomen: soft, non-tender       Assessment:    Pregnancy: G7P0003 Patient Active Problem List   Diagnosis Date Noted  . Pregnant 01/10/2013      [redacted]w[redacted]d G7P0003 New OB visit    Plan:     Initial labs drawn Continue prenatal vitamins Problem list reviewed and updated Reviewed n/v relief  measures and warning s/s to report Reviewed recommended weight gain based on pre-gravid BMI Encouraged well-balanced diet Genetic Screening discussed Quad Screen: requested Cystic fibrosis screening discussed requested Ultrasound discussed; fetal survey: requested Follow up in 1 weeks for anatomy scan and see Dr Despina Hidden rx prenatal plus 1 daily with 11 refills GRIFFIN,JENNIFER 01/10/2013 12:10 PM

## 2013-01-11 LAB — HEPATITIS B SURFACE ANTIGEN: Hepatitis B Surface Ag: NEGATIVE

## 2013-01-11 LAB — AFP, QUAD SCREEN
Age Alone: 1:885 {titer}
Curr Gest Age: 21.2 wks.days
MoM for AFP: 1.25
MoM for hCG: 0.58
Tri 18 Scr Risk Est: NEGATIVE
Trisomy 18 (Edward) Syndrome Interp.: 1:24800 {titer}
uE3 Value: 1.3 ng/mL

## 2013-01-11 LAB — DRUG SCREEN, URINE, NO CONFIRMATION
Amphetamine Screen, Ur: NEGATIVE
Barbiturate Quant, Ur: NEGATIVE
Cocaine Metabolites: NEGATIVE
Marijuana Metabolite: NEGATIVE
Methadone: NEGATIVE
Opiate Screen, Urine: NEGATIVE

## 2013-01-11 LAB — ABO AND RH

## 2013-01-11 LAB — GC/CHLAMYDIA PROBE AMP: CT Probe RNA: NEGATIVE

## 2013-01-11 LAB — HIV ANTIBODY (ROUTINE TESTING W REFLEX): HIV: NONREACTIVE

## 2013-01-11 LAB — TSH: TSH: 2.409 u[IU]/mL (ref 0.350–4.500)

## 2013-01-11 LAB — CYSTIC FIBROSIS DIAGNOSTIC STUDY

## 2013-01-11 LAB — SICKLE CELL SCREEN: Sickle Cell Screen: NEGATIVE

## 2013-01-12 LAB — URINE CULTURE
Colony Count: NO GROWTH
Organism ID, Bacteria: NO GROWTH

## 2013-01-17 ENCOUNTER — Other Ambulatory Visit: Payer: Medicaid Other

## 2013-01-17 ENCOUNTER — Encounter: Payer: Medicaid Other | Admitting: Obstetrics & Gynecology

## 2013-01-22 ENCOUNTER — Encounter: Payer: Medicaid Other | Admitting: Obstetrics & Gynecology

## 2013-01-22 ENCOUNTER — Other Ambulatory Visit: Payer: Medicaid Other

## 2013-01-28 ENCOUNTER — Encounter: Payer: Medicaid Other | Admitting: Obstetrics & Gynecology

## 2013-01-28 ENCOUNTER — Other Ambulatory Visit: Payer: Medicaid Other

## 2013-02-01 ENCOUNTER — Other Ambulatory Visit: Payer: Medicaid Other

## 2013-03-16 ENCOUNTER — Encounter (HOSPITAL_COMMUNITY): Payer: Self-pay | Admitting: Emergency Medicine

## 2013-03-16 ENCOUNTER — Inpatient Hospital Stay (HOSPITAL_COMMUNITY)
Admission: EM | Admit: 2013-03-16 | Discharge: 2013-03-17 | Disposition: A | Discharge status: Home / Self Care | Payer: Medicaid Other | Attending: Obstetrics and Gynecology | Admitting: Obstetrics and Gynecology

## 2013-03-16 DIAGNOSIS — O99330 Smoking (tobacco) complicating pregnancy, unspecified trimester: Secondary | ICD-10-CM | POA: Insufficient documentation

## 2013-03-16 DIAGNOSIS — A499 Bacterial infection, unspecified: Secondary | ICD-10-CM | POA: Insufficient documentation

## 2013-03-16 DIAGNOSIS — R109 Unspecified abdominal pain: Secondary | ICD-10-CM | POA: Insufficient documentation

## 2013-03-16 DIAGNOSIS — IMO0002 Reserved for concepts with insufficient information to code with codable children: Secondary | ICD-10-CM | POA: Insufficient documentation

## 2013-03-16 DIAGNOSIS — M545 Low back pain, unspecified: Secondary | ICD-10-CM | POA: Insufficient documentation

## 2013-03-16 DIAGNOSIS — O98819 Other maternal infectious and parasitic diseases complicating pregnancy, unspecified trimester: Secondary | ICD-10-CM | POA: Insufficient documentation

## 2013-03-16 DIAGNOSIS — A5901 Trichomonal vulvovaginitis: Secondary | ICD-10-CM | POA: Insufficient documentation

## 2013-03-16 DIAGNOSIS — Z3490 Encounter for supervision of normal pregnancy, unspecified, unspecified trimester: Secondary | ICD-10-CM

## 2013-03-16 DIAGNOSIS — O2390 Unspecified genitourinary tract infection in pregnancy, unspecified trimester: Secondary | ICD-10-CM | POA: Insufficient documentation

## 2013-03-16 DIAGNOSIS — N760 Acute vaginitis: Secondary | ICD-10-CM | POA: Insufficient documentation

## 2013-03-16 DIAGNOSIS — Y92009 Unspecified place in unspecified non-institutional (private) residence as the place of occurrence of the external cause: Secondary | ICD-10-CM | POA: Insufficient documentation

## 2013-03-16 DIAGNOSIS — A599 Trichomoniasis, unspecified: Secondary | ICD-10-CM

## 2013-03-16 DIAGNOSIS — X500XXA Overexertion from strenuous movement or load, initial encounter: Secondary | ICD-10-CM | POA: Insufficient documentation

## 2013-03-16 DIAGNOSIS — S39012A Strain of muscle, fascia and tendon of lower back, initial encounter: Secondary | ICD-10-CM

## 2013-03-16 DIAGNOSIS — Y9389 Activity, other specified: Secondary | ICD-10-CM | POA: Insufficient documentation

## 2013-03-16 DIAGNOSIS — B9689 Other specified bacterial agents as the cause of diseases classified elsewhere: Secondary | ICD-10-CM | POA: Insufficient documentation

## 2013-03-16 LAB — WET PREP, GENITAL: Yeast Wet Prep HPF POC: NONE SEEN

## 2013-03-16 MED ORDER — METRONIDAZOLE 500 MG PO TABS
1000.0000 mg | ORAL_TABLET | Freq: Once | ORAL | Status: AC
  Administered 2013-03-16: 1000 mg via ORAL
  Filled 2013-03-16: qty 2

## 2013-03-16 MED ORDER — METRONIDAZOLE 500 MG PO TABS: 500.0000 mg | ORAL_TABLET | Freq: Two times a day (BID) | ORAL | Status: DC

## 2013-03-16 MED ORDER — BUTALBITAL-APAP-CAFFEINE 50-325-40 MG PO TABS: 2.0000 | ORAL_TABLET | Freq: Once | ORAL | Status: DC

## 2013-03-16 MED ORDER — CYCLOBENZAPRINE HCL 10 MG PO TABS: 10.0000 mg | ORAL_TABLET | Freq: Three times a day (TID) | ORAL | Status: DC | PRN

## 2013-03-16 NOTE — ED Provider Notes (Signed)
CSN: [id number]     Arrival date & time 03/16/13  1941 History   First MD Initiated Contact with Patient 03/16/13 1957     Chief Complaint  Patient presents with  . Back Pain    [redacted] weeks pregnant   (Consider location/radiation/quality/duration/timing/severity/associated sxs/prior Treatment) HPI Comments: Patient presents with right sided abdominal pain and groin pain low back pain after trying to lift a TV. She is approximately [redacted] weeks pregnant G5 P4. Last menstrual period April 15. She had a whitish vaginal discharge and mucus but denies any gush of fluid and denies any vaginal bleeding. She has pain across her low back and lower abdomen. No problems with her previous pregnancies. She saw Dr. Emelda Fear at the beginning of her pregnancy but recently returned from [place] and had care there.   The history is provided by the patient.    Past Medical History  Diagnosis Date  . Hx of chlamydia infection   . Pregnant 01/10/2013    To adopt baby out   Past Surgical History  Procedure Laterality Date  . Cholecystectomy     Family History  Problem Relation Age of Onset  . Cancer Maternal Aunt     breast  . Cancer Maternal Grandmother     breast and cervical   History  Substance Use Topics  . Smoking status: Light Tobacco Smoker    Types: Cigarettes  . Smokeless tobacco: Never Used  . Alcohol Use: No   OB History   Grav Para Term Preterm Abortions TAB SAB Ect Mult Living   6 5 0       5     Review of Systems  Constitutional: Negative for activity change and appetite change.  HENT: Negative for rhinorrhea.   Respiratory: Negative for cough, chest tightness and shortness of breath.   Cardiovascular: Negative for chest pain.  Gastrointestinal: Positive for abdominal pain. Negative for nausea and vomiting.  Genitourinary: Positive for vaginal discharge. Negative for dysuria, hematuria and vaginal bleeding.  Musculoskeletal: Positive for arthralgias, back pain and myalgias.  Skin:  Negative for rash.  Neurological: Negative for dizziness, weakness and headaches.  A complete 10 system review of systems was obtained and all systems are negative except as noted in the HPI and PMH.    Allergies  Review of patient's allergies indicates no known allergies.  Home Medications   No current outpatient prescriptions on file. BP 110/64  Pulse 97  Temp(Src) 97.6 F (36.4 C) (Oral)  Resp 18  Ht 5' 4.5" (1.638 m)  Wt 270 lb (122.471 kg)  BMI 45.65 kg/m2  SpO2 100%  LMP 08/14/2012 Physical Exam  Constitutional: She is oriented to person, place, and time. She appears well-developed and well-nourished. No distress.  HENT:  Head: Normocephalic and atraumatic.  Mouth/Throat: Oropharynx is clear and moist. No oropharyngeal exudate.  Eyes: Conjunctivae and EOM are normal. Pupils are equal, round, and reactive to light.  Neck: Normal range of motion. Neck supple.  Cardiovascular: Regular rhythm and normal heart sounds.   No murmur heard. Pulmonary/Chest: Effort normal and breath sounds normal.  Abdominal: Soft. There is tenderness. There is no rebound and no guarding.  Lower abdominal tenderness, gravid abdomen  Genitourinary: Vaginal discharge found.  Cervix is high, no vaginal bleeding,  Chaperone present  Musculoskeletal: Normal range of motion. She exhibits no edema and no tenderness.  Neurological: She is alert and oriented to person, place, and time. No cranial nerve deficit. She exhibits normal muscle tone. Coordination normal.  Skin:  Skin is warm.    ED Course  Procedures (including critical care time) Labs Review Labs Reviewed  WET PREP, GENITAL - Abnormal; Notable for the following:    Trich, Wet Prep FEW (*)    Clue Cells Wet Prep HPF POC FEW (*)    WBC, Wet Prep HPF POC MANY (*)    All other components within normal limits  GC/CHLAMYDIA PROBE AMP  URINALYSIS, ROUTINE W REFLEX MICROSCOPIC   Imaging Review No results found.  EKG Interpretation      Ventricular Rate:    PR Interval:    QRS Duration:   QT Interval:    QTC Calculation:   R Axis:     Text Interpretation:              MDM   1. Pregnancy   2. Abdominal pain    Onset of lower abdominal pain and back pain while trying to lift TV.  expellation of questionable mucus plugs.  No vaginal bleeding. approx 30-[redacted] weeks gestation.  FHT at 140. +baby movement on monitor. Cervix high, no vaginal bleeding.  D/w Dr. Jolayne Panther, on call for Dr. Despina Hidden.  She accepts patient in transfer to MAU. Patient remains stable in ED and does appear to be in active labor.  BP 110/64  Pulse 97  Temp(Src) 97.6 F (36.4 C) (Oral)  Resp 18  Ht 5' 4.5" (1.638 m)  Wt 270 lb (122.471 kg)  BMI 45.65 kg/m2  SpO2 100%  LMP 08/14/2012     Glynn Octave, MD 03/16/13 2325

## 2013-03-16 NOTE — MAU Provider Note (Signed)
History     CSN: [id number]  Arrival date and time: 03/16/13 1941   None     Chief Complaint  Patient presents with  . Back Pain    [redacted] weeks pregnant   HPI  Pt is a 27 yo G6P0005 at [redacted]w[redacted]d wks IUP here with report of helping to move a TV around 1900 tonight.  While going up steps father let go of TV and she then took over and helped her mother carry the TV the remainder of the distance.  Reports feeling back pain while carrying TV.  Pain is described as stabbing pain and rated an 8/10.  Went to the bathroom after moving TV and noticed a thick white discharge in underwear.  No report of vaginal bleeding or thin white fluid.  Pt denies feeling contractions, but continues to have lower back pain.    Pt had initial prenatal care visit at [facility], then moved to [place] and received care there.  Denies problems in pregnancy.  Recently moved back to [place] and plans to resume care at [facility].  While reviewing OB history pt states that she did not have twins as listed in 2008.    Past Medical History  Diagnosis Date  . Hx of chlamydia infection   . Pregnant 01/10/2013    To adopt baby out    Past Surgical History  Procedure Laterality Date  . Cholecystectomy      Family History  Problem Relation Age of Onset  . Cancer Maternal Aunt     breast  . Cancer Maternal Grandmother     breast and cervical    History  Substance Use Topics  . Smoking status: Light Tobacco Smoker    Types: Cigarettes  . Smokeless tobacco: Never Used  . Alcohol Use: No    Allergies: No Known Allergies  Prescriptions prior to admission  Medication Sig Dispense Refill  . acetaminophen (TYLENOL) 500 MG tablet Take 1,000 mg by mouth every 6 (six) hours as needed for pain.      . prenatal vitamin w/FE, FA (PRENATAL 1 + 1) 27-1 MG TABS tablet Take 1 tablet by mouth daily at 12 noon.  30 each  12    Review of Systems  Gastrointestinal: Positive for abdominal pain.  Genitourinary:       Thick  white discharge  Musculoskeletal: Positive for back pain.       Right inner thigh pain  All other systems reviewed and are negative.   Physical Exam   Blood pressure 128/68, pulse 100, temperature 98.3 F (36.8 C), temperature source Oral, resp. rate 20, height 5' 4.5" (1.638 m), weight 122.471 kg (270 lb), last menstrual period 08/14/2012, SpO2 100.00%.  Physical Exam  Constitutional: She is oriented to person, place, and time. She appears well-developed and well-nourished. No distress.  HENT:  Head: Normocephalic.  Neck: Normal range of motion. Neck supple.  Cardiovascular: Normal rate, regular rhythm and normal heart sounds.   Respiratory: Effort normal and breath sounds normal.  GI: Soft. There is no tenderness.  Genitourinary: No bleeding around the vagina. Vaginal discharge (yellowish-green discharge seen on perineum and in vagina.) found.  Musculoskeletal: Normal range of motion. She exhibits no edema.  Neurological: She is alert and oriented to person, place, and time.  Skin: Skin is warm and dry.   Cervix - closed and thick.  FHR 130's, +accels, neg decels Toco - irregular, with resolution prior to discharge (none felt by patient).   2340 Pt requests  discharge home > friend coming to pick her up from [facility] MAU Course  Procedures  Results for orders placed during the hospital encounter of 03/16/13 (from the past 24 hour(s))  WET PREP, GENITAL     Status: Abnormal   Collection Time    03/16/13  9:48 PM      Result Value Range   Yeast Wet Prep HPF POC NONE SEEN  NONE SEEN   Trich, Wet Prep FEW (*) NONE SEEN   Clue Cells Wet Prep HPF POC FEW (*) NONE SEEN   WBC, Wet Prep HPF POC MANY (*) NONE SEEN    Assessment and Plan  27 yo G6P0005 at [redacted]w[redacted]d wks IUP Back Strain Trichomoniasis Bacterial Vaginosis  Plan: Discharge to home RX Flexeril 10 mg PO q 8 hours for pain. RX Flagyl 500 mg BID x 7 days Schedule appointment at [facility] next  week  [facility] 03/16/2013, 9:45 PM

## 2013-03-16 NOTE — Progress Notes (Signed)
Pt presents to [facility], states she has just moved from [place] and has no provider in area. G7 P6 3 living hx twins Pt was seen in 12/2012 as a new OB appointment. [facility] RN states pt doesn't appear in active labor. Monitors applied, remotely monitoring.

## 2013-03-16 NOTE — ED Notes (Signed)
[facility] April 02, 2013, states she was helping to lift a TV and had sudden onset of lower back pain and right lower abd/groin pain.  Pt states pain is with movement at this time. Pt noticed a white mucus from vagina since this happened, no other gush of fluid.

## 2013-03-18 ENCOUNTER — Encounter: Payer: Medicaid Other | Admitting: Obstetrics & Gynecology

## 2013-03-18 LAB — GC/CHLAMYDIA PROBE AMP: CT Probe RNA: NEGATIVE

## 2013-07-02 ENCOUNTER — Encounter: Payer: Self-pay | Admitting: *Deleted

## 2013-07-02 ENCOUNTER — Encounter: Payer: Medicaid Other | Admitting: Obstetrics & Gynecology

## 2013-07-18 ENCOUNTER — Ambulatory Visit: Payer: Medicaid Other | Admitting: Advanced Practice Midwife

## 2013-09-13 ENCOUNTER — Encounter (HOSPITAL_COMMUNITY): Payer: Self-pay | Admitting: *Deleted

## 2014-03-03 ENCOUNTER — Encounter (HOSPITAL_COMMUNITY): Payer: Self-pay | Admitting: *Deleted

## 2015-05-03 NOTE — L&D Delivery Note (Signed)
Delivery Note At 2:29 PM a viable female was delivered via  (presentation: vertex; LOA ).  APGAR:9 ,9 ; weight  .   Placenta status:spontaneous ,shultz .  Cord: 3VC with the following complications:none .  Cord pH: n/a  Anesthesia:  epidural Episiotomy: None Lacerations: None Suture Repair: n/a Est. Blood Loss (mL): 100  Mom to postpartum.  Baby to Couplet care / Skin to Skin.  Annette Zhang 01/31/2016, 2:52 PM

## 2015-09-02 LAB — OB RESULTS CONSOLE ABO/RH: RH TYPE: POSITIVE

## 2015-09-02 LAB — OB RESULTS CONSOLE HGB/HCT, BLOOD
HCT: 34 %
Hemoglobin: 10.9 g/dL

## 2015-09-02 LAB — OB RESULTS CONSOLE VARICELLA ZOSTER ANTIBODY, IGG: VARICELLA IGG: IMMUNE

## 2015-09-02 LAB — OB RESULTS CONSOLE RPR: RPR: NONREACTIVE

## 2015-09-02 LAB — OB RESULTS CONSOLE ANTIBODY SCREEN: ANTIBODY SCREEN: NEGATIVE

## 2015-09-02 LAB — OB RESULTS CONSOLE HEPATITIS B SURFACE ANTIGEN: Hepatitis B Surface Ag: NEGATIVE

## 2015-09-02 LAB — OB RESULTS CONSOLE PLATELET COUNT: PLATELETS: 320 10*3/uL

## 2015-09-02 LAB — OB RESULTS CONSOLE RUBELLA ANTIBODY, IGM: RUBELLA: IMMUNE

## 2015-09-02 LAB — OB RESULTS CONSOLE GC/CHLAMYDIA
CHLAMYDIA, DNA PROBE: NEGATIVE
GC PROBE AMP, GENITAL: NEGATIVE

## 2015-09-02 LAB — OB RESULTS CONSOLE HIV ANTIBODY (ROUTINE TESTING): HIV: NONREACTIVE

## 2015-09-03 ENCOUNTER — Other Ambulatory Visit: Payer: Self-pay | Admitting: Obstetrics and Gynecology

## 2015-09-03 DIAGNOSIS — Z1389 Encounter for screening for other disorder: Secondary | ICD-10-CM

## 2015-09-04 ENCOUNTER — Ambulatory Visit (INDEPENDENT_AMBULATORY_CARE_PROVIDER_SITE_OTHER): Payer: Medicaid Other

## 2015-09-04 DIAGNOSIS — Z1389 Encounter for screening for other disorder: Secondary | ICD-10-CM

## 2015-09-04 DIAGNOSIS — O0932 Supervision of pregnancy with insufficient antenatal care, second trimester: Secondary | ICD-10-CM | POA: Diagnosis not present

## 2015-09-04 DIAGNOSIS — Z36 Encounter for antenatal screening of mother: Secondary | ICD-10-CM | POA: Diagnosis not present

## 2015-09-04 NOTE — Progress Notes (Signed)
US 18+1 wks,single IUP, pos fht 161 bpm,efw 224 g,normal ov's bilat,post pl gr 0,svp 4.5cm,cx 4.1cm,limited view of heart,spine,and gender,please have pt come back for additional images.,no obvious abnormalities seen

## 2015-09-09 ENCOUNTER — Encounter: Payer: Medicaid Other | Admitting: Women's Health

## 2015-09-23 ENCOUNTER — Encounter: Payer: Self-pay | Admitting: *Deleted

## 2015-09-23 ENCOUNTER — Encounter: Payer: Self-pay | Admitting: Advanced Practice Midwife

## 2015-09-23 ENCOUNTER — Ambulatory Visit (INDEPENDENT_AMBULATORY_CARE_PROVIDER_SITE_OTHER): Payer: Medicaid Other | Admitting: Advanced Practice Midwife

## 2015-09-23 VITALS — BP 138/84 | HR 86 | Wt 280.5 lb

## 2015-09-23 DIAGNOSIS — Z331 Pregnant state, incidental: Secondary | ICD-10-CM

## 2015-09-23 DIAGNOSIS — O289 Unspecified abnormal findings on antenatal screening of mother: Secondary | ICD-10-CM

## 2015-09-23 DIAGNOSIS — Z3490 Encounter for supervision of normal pregnancy, unspecified, unspecified trimester: Secondary | ICD-10-CM | POA: Insufficient documentation

## 2015-09-23 DIAGNOSIS — Z3A21 21 weeks gestation of pregnancy: Secondary | ICD-10-CM

## 2015-09-23 DIAGNOSIS — Z3482 Encounter for supervision of other normal pregnancy, second trimester: Secondary | ICD-10-CM | POA: Diagnosis not present

## 2015-09-23 DIAGNOSIS — Z0283 Encounter for blood-alcohol and blood-drug test: Secondary | ICD-10-CM

## 2015-09-23 DIAGNOSIS — Z1389 Encounter for screening for other disorder: Secondary | ICD-10-CM

## 2015-09-23 DIAGNOSIS — O280 Abnormal hematological finding on antenatal screening of mother: Secondary | ICD-10-CM

## 2015-09-23 DIAGNOSIS — Z3492 Encounter for supervision of normal pregnancy, unspecified, second trimester: Secondary | ICD-10-CM

## 2015-09-23 LAB — POCT URINALYSIS DIPSTICK
GLUCOSE UA: NEGATIVE
Ketones, UA: NEGATIVE
Nitrite, UA: POSITIVE
RBC UA: NEGATIVE

## 2015-09-23 MED ORDER — NITROFURANTOIN MONOHYD MACRO 100 MG PO CAPS
100.0000 mg | ORAL_CAPSULE | Freq: Two times a day (BID) | ORAL | Status: AC
Start: 2015-09-23 — End: 2015-09-30

## 2015-09-23 NOTE — Patient Instructions (Signed)
Safe Medications in Pregnancy   Acne: Benzoyl Peroxide Salicylic Acid  Backache/Headache: Tylenol: 2 regular strength every 4 hours OR              2 Extra strength every 6 hours  Colds/Coughs/Allergies: Benadryl (alcohol free) 25 mg every 6 hours as needed Breath right strips Claritin Cepacol throat lozenges Chloraseptic throat spray Cold-Eeze- up to three times per day Cough drops, alcohol free Flonase (by prescription only) Guaifenesin Mucinex Robitussin DM (plain only, alcohol free) Saline nasal spray/drops Sudafed (pseudoephedrine) & Actifed ** use only after [redacted] weeks gestation and if you do not have high blood pressure Tylenol Vicks Vaporub Zinc lozenges Zyrtec   Constipation: Colace Ducolax suppositories Fleet enema Glycerin suppositories Metamucil Milk of magnesia Miralax Senokot Smooth move tea  Diarrhea: Kaopectate Imodium A-D  *NO pepto Bismol  Hemorrhoids: Anusol Anusol HC Preparation H Tucks  Indigestion: Tums Maalox Mylanta Zantac  Pepcid  Insomnia: Benadryl (alcohol free) 25mg every 6 hours as needed Tylenol PM Unisom, no Gelcaps  Leg Cramps: Tums MagGel  Nausea/Vomiting:  Bonine Dramamine Emetrol Ginger extract Sea bands Meclizine  Nausea medication to take during pregnancy:  Unisom (doxylamine succinate 25 mg tablets) Take one tablet daily at bedtime. If symptoms are not adequately controlled, the dose can be increased to a maximum recommended dose of two tablets daily (1/2 tablet in the morning, 1/2 tablet mid-afternoon and one at bedtime). Vitamin B6 100mg tablets. Take one tablet twice a day (up to 200 mg per day).  Skin Rashes: Aveeno products Benadryl cream or 25mg every 6 hours as needed Calamine Lotion 1% cortisone cream  Yeast infection: Gyne-lotrimin 7 Monistat 7   **If taking multiple medications, please check labels to avoid duplicating the same active ingredients **take medication as directed on  the label ** Do not exceed 4000 mg of tylenol in 24 hours **Do not take medications that contain aspirin or ibuprofen       Second Trimester of Pregnancy The second trimester is from week 13 through week 28, months 4 through 6. The second trimester is often a time when you feel your best. Your body has also adjusted to being pregnant, and you begin to feel better physically. Usually, morning sickness has lessened or quit completely, you may have more energy, and you may have an increase in appetite. The second trimester is also a time when the fetus is growing rapidly. At the end of the sixth month, the fetus is about 9 inches long and weighs about 1 pounds. You will likely begin to feel the baby move (quickening) between 18 and 20 weeks of the pregnancy. BODY CHANGES Your body goes through many changes during pregnancy. The changes vary from woman to woman.   Your weight will continue to increase. You will notice your lower abdomen bulging out.  You may begin to get stretch marks on your hips, abdomen, and breasts.  You may develop headaches that can be relieved by medicines approved by your health care provider.  You may urinate more often because the fetus is pressing on your bladder.  You may develop or continue to have heartburn as a result of your pregnancy.  You may develop constipation because certain hormones are causing the muscles that push waste through your intestines to slow down.  You may develop hemorrhoids or swollen, bulging veins (varicose veins).  You may have back pain because of the weight gain and pregnancy hormones relaxing your joints between the bones in your pelvis and as   a result of a shift in weight and the muscles that support your balance.  Your breasts will continue to grow and be tender.  Your gums may bleed and may be sensitive to brushing and flossing.  Dark spots or blotches (chloasma, mask of pregnancy) may develop on your face. This will likely  fade after the baby is born.  A dark line from your belly button to the pubic area (linea nigra) may appear. This will likely fade after the baby is born.  You may have changes in your hair. These can include thickening of your hair, rapid growth, and changes in texture. Some women also have hair loss during or after pregnancy, or hair that feels dry or thin. Your hair will most likely return to normal after your baby is born. WHAT TO EXPECT AT YOUR PRENATAL VISITS During a routine prenatal visit:  You will be weighed to make sure you and the fetus are growing normally.  Your blood pressure will be taken.  Your abdomen will be measured to track your baby's growth.  The fetal heartbeat will be listened to.  Any test results from the previous visit will be discussed. Your health care provider may ask you:  How you are feeling.  If you are feeling the baby move.  If you have had any abnormal symptoms, such as leaking fluid, bleeding, severe headaches, or abdominal cramping.  If you are using any tobacco products, including cigarettes, chewing tobacco, and electronic cigarettes.  If you have any questions. Other tests that may be performed during your second trimester include:  Blood tests that check for:  Low iron levels (anemia).  Gestational diabetes (between 24 and 28 weeks).  Rh antibodies.  Urine tests to check for infections, diabetes, or protein in the urine.  An ultrasound to confirm the proper growth and development of the baby.  An amniocentesis to check for possible genetic problems.  Fetal screens for spina bifida and Down syndrome.  HIV (human immunodeficiency virus) testing. Routine prenatal testing includes screening for HIV, unless you choose not to have this test. HOME CARE INSTRUCTIONS   Avoid all smoking, herbs, alcohol, and unprescribed drugs. These chemicals affect the formation and growth of the baby.  Do not use any tobacco products, including  cigarettes, chewing tobacco, and electronic cigarettes. If you need help quitting, ask your health care provider. You may receive counseling support and other resources to help you quit.  Follow your health care provider's instructions regarding medicine use. There are medicines that are either safe or unsafe to take during pregnancy.  Exercise only as directed by your health care provider. Experiencing uterine cramps is a good sign to stop exercising.  Continue to eat regular, healthy meals.  Wear a good support bra for breast tenderness.  Do not use hot tubs, steam rooms, or saunas.  Wear your seat belt at all times when driving.  Avoid raw meat, uncooked cheese, cat litter boxes, and soil used by cats. These carry germs that can cause birth defects in the baby.  Take your prenatal vitamins.  Take 1500-2000 mg of calcium daily starting at the 20th week of pregnancy until you deliver your baby.  Try taking a stool softener (if your health care provider approves) if you develop constipation. Eat more high-fiber foods, such as fresh vegetables or fruit and whole grains. Drink plenty of fluids to keep your urine clear or pale yellow.  Take warm sitz baths to soothe any pain or discomfort caused by   hemorrhoids. Use hemorrhoid cream if your health care provider approves.  If you develop varicose veins, wear support hose. Elevate your feet for 15 minutes, 3-4 times a day. Limit salt in your diet.  Avoid heavy lifting, wear low heel shoes, and practice good posture.  Rest with your legs elevated if you have leg cramps or low back pain.  Visit your dentist if you have not gone yet during your pregnancy. Use a soft toothbrush to brush your teeth and be gentle when you floss.  A sexual relationship may be continued unless your health care provider directs you otherwise.  Continue to go to all your prenatal visits as directed by your health care provider. SEEK MEDICAL CARE IF:   You have  dizziness.  You have mild pelvic cramps, pelvic pressure, or nagging pain in the abdominal area.  You have persistent nausea, vomiting, or diarrhea.  You have a bad smelling vaginal discharge.  You have pain with urination. SEEK IMMEDIATE MEDICAL CARE IF:   You have a fever.  You are leaking fluid from your vagina.  You have spotting or bleeding from your vagina.  You have severe abdominal cramping or pain.  You have rapid weight gain or loss.  You have shortness of breath with chest pain.  You notice sudden or extreme swelling of your face, hands, ankles, feet, or legs.  You have not felt your baby move in over an hour.  You have severe headaches that do not go away with medicine.  You have vision changes.   This information is not intended to replace advice given to you by your health care provider. Make sure you discuss any questions you have with your health care provider.   Document Released: 04/12/2001 Document Revised: 05/09/2014 Document Reviewed: 06/19/2012 Elsevier Interactive Patient Education 2016 Elsevier Inc.  

## 2015-09-23 NOTE — Addendum Note (Signed)
Addended by: Criss AlvinePULLIAM, Johnie Makki G on: 09/23/2015 04:15 PM   Modules accepted: Orders

## 2015-09-23 NOTE — Progress Notes (Signed)
  Subjective:    Annette Zhang is a [id number] being seen today for her first obstetrical visit.  Her obstetrical history is significant for 5 SVDs at term, one adopted out.  [facility] d/t abnormal AFP. Had DSR 1:263, but it looks like the dates are wrong.  Discussed CfDNA, amnio, doing nothing, Will do Informaique. . Pregnancy history fully reviewed. Her last baby was 11#3oz, "pushed twice".  Had normal Gtt at 28 weeks.   Patient reports no complaints.  Filed Vitals:   09/23/15 1032  BP: 138/84  Pulse: 86  Weight: 280 lb 8 oz (127.234 kg)    HISTORY: OB History  Gravida Para Term Preterm AB SAB TAB Ectopic Multiple Living  7 5 5  0 1 1 0 0 0 5    # Outcome Date GA Lbr Len/2nd Weight Sex Delivery Anes PTL Lv  7 Current           6 Term 2012   11 lb 3 oz (5.075 kg) F Vag-Spont   Y  5 Term 2011    M Vag-Spont   Y  4 Term 2009    M Vag-Spont   Y  3 SAB 2008          2 Term 2007    M Vag-Spont   Y  1 Term 2005   6 lb 5 oz (2.863 kg) F Vag-Spont   Y     Past Medical History  Diagnosis Date  . Hx of chlamydia infection   . Pregnant 01/10/2013    To adopt baby out   Past Surgical History  Procedure Laterality Date  . Cholecystectomy     Family History  Problem Relation Age of Onset  . Cancer Maternal Aunt     breast  . Cancer Maternal Grandmother     breast and cervical     Exam                                      System:     Skin: normal coloration and turgor, no rashes    Neurologic: oriented, normal, normal mood   Extremities: normal strength, tone, and muscle mass   HEENT PERRLA   Mouth/Teeth mucous membranes moist, poor dentition   Neck supple and no masses   Cardiovascular: regular rate and rhythm   Respiratory:  appears well, vitals normal, no respiratory distress, acyanotic   Abdomen: soft, non-tender;  FHR: 150          Assessment:    Pregnancy: G7P0005 Patient Active Problem List   Diagnosis  Date Noted  . Supervision of normal pregnancy 09/23/2015        Plan:     Continue prenatal vitamins  Problem list reviewed and updated  Reviewed recommended weight gain based on pre-gravid BMI :  Has already gained 30#, warning given Encouraged well-balanced diet Genetic Screening discussed Quad Screen: 1:263 DSR.  will do CfDNA today.  Ultrasound discussed; fetal survey: needs additional views.  Return in about 4 weeks (around 10/21/2015) for LROB.  CRESENZO-DISHMAN,Annette Zhang 09/23/2015

## 2015-09-25 LAB — URINE CULTURE

## 2015-09-27 ENCOUNTER — Encounter (HOSPITAL_COMMUNITY): Payer: Self-pay | Admitting: *Deleted

## 2015-09-27 ENCOUNTER — Inpatient Hospital Stay (HOSPITAL_COMMUNITY)
Admission: AD | Admit: 2015-09-27 | Discharge: 2015-09-27 | Disposition: A | Discharge status: Home / Self Care | Payer: Medicaid Other | Source: Ambulatory Visit | Attending: Family Medicine | Admitting: Family Medicine

## 2015-09-27 DIAGNOSIS — Z3A21 21 weeks gestation of pregnancy: Secondary | ICD-10-CM | POA: Diagnosis not present

## 2015-09-27 DIAGNOSIS — O99332 Smoking (tobacco) complicating pregnancy, second trimester: Secondary | ICD-10-CM | POA: Insufficient documentation

## 2015-09-27 DIAGNOSIS — O218 Other vomiting complicating pregnancy: Secondary | ICD-10-CM | POA: Insufficient documentation

## 2015-09-27 DIAGNOSIS — J020 Streptococcal pharyngitis: Secondary | ICD-10-CM | POA: Diagnosis not present

## 2015-09-27 DIAGNOSIS — O98812 Other maternal infectious and parasitic diseases complicating pregnancy, second trimester: Secondary | ICD-10-CM | POA: Diagnosis not present

## 2015-09-27 DIAGNOSIS — J029 Acute pharyngitis, unspecified: Secondary | ICD-10-CM | POA: Diagnosis present

## 2015-09-27 DIAGNOSIS — F17210 Nicotine dependence, cigarettes, uncomplicated: Secondary | ICD-10-CM | POA: Diagnosis not present

## 2015-09-27 LAB — URINALYSIS, ROUTINE W REFLEX MICROSCOPIC
Glucose, UA: NEGATIVE mg/dL
Hgb urine dipstick: NEGATIVE
LEUKOCYTES UA: NEGATIVE
NITRITE: NEGATIVE
PH: 6 (ref 5.0–8.0)
Protein, ur: 100 mg/dL — AB
Specific Gravity, Urine: 1.025 (ref 1.005–1.030)

## 2015-09-27 LAB — URINE MICROSCOPIC-ADD ON
RBC / HPF: NONE SEEN RBC/hpf (ref 0–5)
WBC UA: NONE SEEN WBC/hpf (ref 0–5)

## 2015-09-27 MED ORDER — ACETAMINOPHEN-CODEINE #3 300-30 MG PO TABS: 1.0000 | ORAL_TABLET | ORAL | Status: DC | PRN

## 2015-09-27 MED ORDER — ONDANSETRON 8 MG PO TBDP
8.0000 mg | ORAL_TABLET | Freq: Once | ORAL | Status: AC
  Administered 2015-09-27: 8 mg via ORAL
  Filled 2015-09-27: qty 1

## 2015-09-27 MED ORDER — PENICILLIN G BENZATHINE 1200000 UNIT/2ML IM SUSP
1.2000 10*6.[IU] | Freq: Once | INTRAMUSCULAR | Status: AC
  Administered 2015-09-27: 1.2 10*6.[IU] via INTRAMUSCULAR
  Filled 2015-09-27: qty 2

## 2015-09-27 MED ORDER — AMOXICILLIN-POT CLAVULANATE 875-125 MG PO TABS: 1.0000 | ORAL_TABLET | Freq: Two times a day (BID) | ORAL | Status: DC

## 2015-09-27 MED ORDER — AMOXICILLIN-POT CLAVULANATE 875-125 MG PO TABS: 1.0000 | ORAL_TABLET | Freq: Two times a day (BID) | ORAL | Status: AC

## 2015-09-27 MED ORDER — PENICILLIN G BENZATHINE & PROC 1200000 UNIT/2ML IM SUSP: 1.2000 10*6.[IU] | Freq: Once | INTRAMUSCULAR | Status: DC

## 2015-09-27 MED ORDER — ONDANSETRON 8 MG PO TBDP: 8.0000 mg | ORAL_TABLET | Freq: Three times a day (TID) | ORAL | Status: DC | PRN

## 2015-09-27 MED ORDER — ACETAMINOPHEN-CODEINE #3 300-30 MG PO TABS
1.0000 | ORAL_TABLET | ORAL | Status: DC | PRN
Start: 2015-09-27 — End: 2015-09-28

## 2015-09-27 NOTE — MAU Note (Signed)
Pt c/o not being able to eat or drink x4 days. Started having some vomiting and diarrhea 2 days ago. Having some congestion and sweating. Has not checked temp at home. Has not been around anyone sick.

## 2015-09-27 NOTE — Discharge Instructions (Signed)
Pharyngitis Pharyngitis is redness, pain, and swelling (inflammation) of your pharynx.  CAUSES  Pharyngitis is usually caused by infection. Most of the time, these infections are from viruses (viral) and are part of a cold. However, sometimes pharyngitis is caused by bacteria (bacterial). Pharyngitis can also be caused by allergies. Viral pharyngitis may be spread from person to person by coughing, sneezing, and personal items or utensils (cups, forks, spoons, toothbrushes). Bacterial pharyngitis may be spread from person to person by more intimate contact, such as kissing.  SIGNS AND SYMPTOMS  Symptoms of pharyngitis include:   Sore throat.   Tiredness (fatigue).   Low-grade fever.   Headache.  Joint pain and muscle aches.  Skin rashes.  Swollen lymph nodes.  Plaque-like film on throat or tonsils (often seen with bacterial pharyngitis). DIAGNOSIS  Your health care provider will ask you questions about your illness and your symptoms. Your medical history, along with a physical exam, is often all that is needed to diagnose pharyngitis. Sometimes, a rapid strep test is done. Other lab tests may also be done, depending on the suspected cause.  TREATMENT  Viral pharyngitis will usually get better in 3-4 days without the use of medicine. Bacterial pharyngitis is treated with medicines that kill germs (antibiotics).  HOME CARE INSTRUCTIONS   Drink enough water and fluids to keep your urine clear or pale yellow.   Only take over-the-counter or prescription medicines as directed by your health care provider:   If you are prescribed antibiotics, make sure you finish them even if you start to feel better.   Do not take aspirin.   Get lots of rest.   Gargle with 8 oz of salt water ( tsp of salt per 1 qt of water) as often as every 1-2 hours to soothe your throat.   Throat lozenges (if you are not at risk for choking) or sprays may be used to soothe your throat. SEEK MEDICAL  CARE IF:   You have large, tender lumps in your neck.  You have a rash.  You cough up green, yellow-brown, or bloody spit. SEEK IMMEDIATE MEDICAL CARE IF:   Your neck becomes stiff.  You drool or are unable to swallow liquids.  You vomit or are unable to keep medicines or liquids down.  You have severe pain that does not go away with the use of recommended medicines.  You have trouble breathing (not caused by a stuffy nose). MAKE SURE YOU:   Understand these instructions.  Will watch your condition.  Will get help right away if you are not doing well or get worse.   This information is not intended to replace advice given to you by your health care provider. Make sure you discuss any questions you have with your health care provider.   Document Released: 04/18/2005 Document Revised: 02/06/2013 Document Reviewed: 12/24/2012 Elsevier Interactive Patient Education 2016 Elsevier Inc.  Sore Throat A sore throat is pain, burning, irritation, or scratchiness of the throat. There is often pain or tenderness when swallowing or talking. A sore throat may be accompanied by other symptoms, such as coughing, sneezing, fever, and swollen neck glands. A sore throat is often the first sign of another sickness, such as a cold, flu, strep throat, or mononucleosis (commonly known as mono). Most sore throats go away without medical treatment. CAUSES  The most common causes of a sore throat include:  A viral infection, such as a cold, flu, or mono.  A bacterial infection, such as strep throat,  tonsillitis, or whooping cough. °· Seasonal allergies. °· Dryness in the air. °· Irritants, such as smoke or pollution. °· Gastroesophageal reflux disease (GERD). °HOME CARE INSTRUCTIONS  °· Only take over-the-counter medicines as directed by your caregiver. °· Drink enough fluids to keep your urine clear or pale yellow. °· Rest as needed. °· Try using throat sprays, lozenges, or sucking on hard candy to ease  any pain (if older than 4 years or as directed). °· Sip warm liquids, such as broth, herbal tea, or warm water with honey to relieve pain temporarily. You may also eat or drink cold or frozen liquids such as frozen ice pops. °· Gargle with salt water (mix 1 tsp salt with 8 oz of water). °· Do not smoke and avoid secondhand smoke. °· Put a cool-mist humidifier in your bedroom at night to moisten the air. You can also turn on a hot shower and sit in the bathroom with the door closed for 5-10 minutes. °SEEK IMMEDIATE MEDICAL CARE IF: °· You have difficulty breathing. °· You are unable to swallow fluids, soft foods, or your saliva. °· You have increased swelling in the throat. °· Your sore throat does not get better in 7 days. °· You have nausea and vomiting. °· You have a fever or persistent symptoms for more than 2-3 days. °· You have a fever and your symptoms suddenly get worse. °MAKE SURE YOU:  °· Understand these instructions. °· Will watch your condition. °· Will get help right away if you are not doing well or get worse. °  °This information is not intended to replace advice given to you by your health care provider. Make sure you discuss any questions you have with your health care provider. °  °Document Released: 05/26/2004 Document Revised: 05/09/2014 Document Reviewed: 12/25/2011 °Elsevier Interactive Patient Education ©2016 Elsevier Inc. ° °

## 2015-09-27 NOTE — MAU Provider Note (Signed)
History   G7P5015 @ 21.3 wks in with c/o sore throat, ear pain, rinorrhea, fever for past four days. Nausea and vomiting past two days.  CSN: [id number]  Arrival date & time 09/27/15  2148   None     No chief complaint on file.   HPI  Past Medical History  Diagnosis Date  . Hx of chlamydia infection   . Pregnant 01/10/2013    To adopt baby out    Past Surgical History  Procedure Laterality Date  . Cholecystectomy      Family History  Problem Relation Age of Onset  . Cancer Maternal Aunt     breast  . Cancer Maternal Grandmother     breast and cervical    Social History  Substance Use Topics  . Smoking status: Light Tobacco Smoker    Types: Cigarettes  . Smokeless tobacco: Never Used  . Alcohol Use: No    OB History    Gravida Para Term Preterm AB TAB SAB Ectopic Multiple Living   7 5 5  0 1 0 1 0 0 5      Review of Systems  Constitutional: Positive for fever.  HENT: Positive for ear pain, rhinorrhea and sore throat.   Eyes: Negative.   Respiratory: Negative.   Cardiovascular: Negative.   Gastrointestinal: Positive for nausea and vomiting.  Endocrine: Negative.   Genitourinary: Negative.   Musculoskeletal: Negative.   Skin: Negative.   Allergic/Immunologic: Negative.   Neurological: Negative.   Hematological: Negative.   Psychiatric/Behavioral: Negative.     Allergies  Review of patient's allergies indicates no known allergies.  Home Medications  No current outpatient prescriptions on file.  BP 138/72 mmHg  Pulse 135  Temp(Src) 99.6 F (37.6 C)  Ht 5\' 5"  (1.651 m)  Wt 274 lb (124.286 kg)  BMI 45.60 kg/m2  SpO2 98%  LMP 04/04/2015 (Exact Date)  Physical Exam  Constitutional: She is oriented to person, place, and time. She appears well-developed.  HENT:  Head: Normocephalic.  Eyes: Pupils are equal, round, and reactive to light.  Neck: Normal range of motion.  Cardiovascular: Normal rate, regular rhythm, normal heart sounds and intact  distal pulses.   Pulmonary/Chest: Effort normal and breath sounds normal.  Abdominal: Soft. Bowel sounds are normal.  Musculoskeletal: Normal range of motion.  Neurological: She is alert and oriented to person, place, and time. She has normal reflexes.  Skin: Skin is warm and dry.  Psychiatric: She has a normal mood and affect. Her behavior is normal. Judgment and thought content normal.    MAU Course  Procedures (including critical care time)  Labs Reviewed  URINALYSIS, ROUTINE W REFLEX MICROSCOPIC (NOT AT [facility]) - Abnormal; Notable for the following:    Color, Urine AMBER (*)    Bilirubin Urine MODERATE (*)    Ketones, ur >80 (*)    Protein, ur 100 (*)    All other components within normal limits  URINE MICROSCOPIC-ADD ON - Abnormal; Notable for the following:    Squamous Epithelial / LPF 0-5 (*)    Bacteria, UA FEW (*)    All other components within normal limits   No results found.   No diagnosis found.    MDM  Dx: strep throat. Multiple pustules over tonsils with erythemia, temp 99.6. LCTAB, will tx with IM penicillin and start on po augmentin and manage pain. Will d/c home if retains po's.

## 2015-09-29 ENCOUNTER — Telehealth: Payer: Self-pay | Admitting: *Deleted

## 2015-09-29 NOTE — Telephone Encounter (Signed)
Pt informed the InformaSeq results still pending.   [facility] and was given [facility]in for Strep Throat, c/o pills being hard to swallow due to size. Pt informed this was the only treatment for Strep. Pt verbalized understanding.

## 2015-10-01 ENCOUNTER — Telehealth: Payer: Self-pay | Admitting: Advanced Practice Midwife

## 2015-10-01 LAB — INFORMASEQ(SM) WITH XY ANALYSIS
FETAL NUMBER: 1
Fetal Fraction (%):: 6.3
Gestational Age at Collection: 20.6 weeks
Weight: 280 [lb_av]

## 2015-10-01 NOTE — Telephone Encounter (Signed)
Pt informed the InformaSeq still pending. Pt informed [facility] called this week needing more information to process the InformaSeq, may be next week before we get the results.  Pt verbalized understanding.

## 2015-10-02 ENCOUNTER — Encounter: Payer: Self-pay | Admitting: Advanced Practice Midwife

## 2015-10-21 ENCOUNTER — Encounter: Payer: Medicaid Other | Admitting: Women's Health

## 2015-10-26 ENCOUNTER — Encounter: Payer: Medicaid Other | Admitting: Women's Health

## 2015-10-28 ENCOUNTER — Encounter: Payer: Self-pay | Admitting: Women's Health

## 2015-10-28 ENCOUNTER — Ambulatory Visit (INDEPENDENT_AMBULATORY_CARE_PROVIDER_SITE_OTHER): Payer: Medicaid Other | Admitting: Women's Health

## 2015-10-28 VITALS — BP 100/80 | HR 84 | Wt 284.0 lb

## 2015-10-28 DIAGNOSIS — Z3492 Encounter for supervision of normal pregnancy, unspecified, second trimester: Secondary | ICD-10-CM

## 2015-10-28 DIAGNOSIS — O09299 Supervision of pregnancy with other poor reproductive or obstetric history, unspecified trimester: Secondary | ICD-10-CM | POA: Insufficient documentation

## 2015-10-28 DIAGNOSIS — Z1389 Encounter for screening for other disorder: Secondary | ICD-10-CM

## 2015-10-28 DIAGNOSIS — Z331 Pregnant state, incidental: Secondary | ICD-10-CM

## 2015-10-28 DIAGNOSIS — O09292 Supervision of pregnancy with other poor reproductive or obstetric history, second trimester: Secondary | ICD-10-CM

## 2015-10-28 DIAGNOSIS — Z369 Encounter for antenatal screening, unspecified: Secondary | ICD-10-CM

## 2015-10-28 DIAGNOSIS — Z363 Encounter for antenatal screening for malformations: Secondary | ICD-10-CM

## 2015-10-28 LAB — POCT URINALYSIS DIPSTICK
Blood, UA: NEGATIVE
GLUCOSE UA: NEGATIVE
Ketones, UA: NEGATIVE
LEUKOCYTES UA: NEGATIVE
Nitrite, UA: NEGATIVE

## 2015-10-28 NOTE — Patient Instructions (Signed)
You will have your sugar test next visit.  Please do not eat or drink anything after midnight the night before you come, not even water.  You will be here for at least two hours.     Call the office ([phone]) or go to [facility] if:  You begin to have strong, frequent contractions  Your water breaks.  Sometimes it is a big gush of fluid, sometimes it is just a trickle that keeps getting your panties wet or running down your legs  You have vaginal bleeding.  It is normal to have a small amount of spotting if your cervix was checked.   You don't feel your baby moving like normal.  If you don't, get you something to eat and drink and lay down and focus on feeling your baby move.   If your baby is still not moving like normal, you should call the office or go to [facility].  Second Trimester of Pregnancy The second trimester is from week 13 through week 28, months 4 through 6. The second trimester is often a time when you feel your best. Your body has also adjusted to being pregnant, and you begin to feel better physically. Usually, morning sickness has lessened or quit completely, you may have more energy, and you may have an increase in appetite. The second trimester is also a time when the fetus is growing rapidly. At the end of the sixth month, the fetus is about 9 inches long and weighs about 1 pounds. You will likely begin to feel the baby move (quickening) between 18 and 20 weeks of the pregnancy. BODY CHANGES Your body goes through many changes during pregnancy. The changes vary from woman to woman.   Your weight will continue to increase. You will notice your lower abdomen bulging out.  You may begin to get stretch marks on your hips, abdomen, and breasts.  You may develop headaches that can be relieved by medicines approved by your health care provider.  You may urinate more often because the fetus is pressing on your bladder.  You may develop or continue to have  heartburn as a result of your pregnancy.  You may develop constipation because certain hormones are causing the muscles that push waste through your intestines to slow down.  You may develop hemorrhoids or swollen, bulging veins (varicose veins).  You may have back pain because of the weight gain and pregnancy hormones relaxing your joints between the bones in your pelvis and as a result of a shift in weight and the muscles that support your balance.  Your breasts will continue to grow and be tender.  Your gums may bleed and may be sensitive to brushing and flossing.  Dark spots or blotches (chloasma, mask of pregnancy) may develop on your face. This will likely fade after the baby is born.  A dark line from your belly button to the pubic area (linea nigra) may appear. This will likely fade after the baby is born.  You may have changes in your hair. These can include thickening of your hair, rapid growth, and changes in texture. Some women also have hair loss during or after pregnancy, or hair that feels dry or thin. Your hair will most likely return to normal after your baby is born. WHAT TO EXPECT AT YOUR PRENATAL VISITS During a routine prenatal visit:  You will be weighed to make sure you and the fetus are growing normally.  Your blood pressure will be taken.    Your abdomen will be measured to track your baby's growth.  The fetal heartbeat will be listened to.  Any test results from the previous visit will be discussed. Your health care provider may ask you:  How you are feeling.  If you are feeling the baby move.  If you have had any abnormal symptoms, such as leaking fluid, bleeding, severe headaches, or abdominal cramping.  If you have any questions. Other tests that may be performed during your second trimester include:  Blood tests that check for:  Low iron levels (anemia).  Gestational diabetes (between 24 and 28 weeks).  Rh antibodies.  Urine tests to check  for infections, diabetes, or protein in the urine.  An ultrasound to confirm the proper growth and development of the baby.  An amniocentesis to check for possible genetic problems.  Fetal screens for spina bifida and Down syndrome. HOME CARE INSTRUCTIONS   Avoid all smoking, herbs, alcohol, and unprescribed drugs. These chemicals affect the formation and growth of the baby.  Follow your health care provider's instructions regarding medicine use. There are medicines that are either safe or unsafe to take during pregnancy.  Exercise only as directed by your health care provider. Experiencing uterine cramps is a good sign to stop exercising.  Continue to eat regular, healthy meals.  Wear a good support bra for breast tenderness.  Do not use hot tubs, steam rooms, or saunas.  Wear your seat belt at all times when driving.  Avoid raw meat, uncooked cheese, cat litter boxes, and soil used by cats. These carry germs that can cause birth defects in the baby.  Take your prenatal vitamins.  Try taking a stool softener (if your health care provider approves) if you develop constipation. Eat more high-fiber foods, such as fresh vegetables or fruit and whole grains. Drink plenty of fluids to keep your urine clear or pale yellow.  Take warm sitz baths to soothe any pain or discomfort caused by hemorrhoids. Use hemorrhoid cream if your health care provider approves.  If you develop varicose veins, wear support hose. Elevate your feet for 15 minutes, 3-4 times a day. Limit salt in your diet.  Avoid heavy lifting, wear low heel shoes, and practice good posture.  Rest with your legs elevated if you have leg cramps or low back pain.  Visit your dentist if you have not gone yet during your pregnancy. Use a soft toothbrush to brush your teeth and be gentle when you floss.  A sexual relationship may be continued unless your health care provider directs you otherwise.  Continue to go to all your  prenatal visits as directed by your health care provider. SEEK MEDICAL CARE IF:   You have dizziness.  You have mild pelvic cramps, pelvic pressure, or nagging pain in the abdominal area.  You have persistent nausea, vomiting, or diarrhea.  You have a bad smelling vaginal discharge.  You have pain with urination. SEEK IMMEDIATE MEDICAL CARE IF:   You have a fever.  You are leaking fluid from your vagina.  You have spotting or bleeding from your vagina.  You have severe abdominal cramping or pain.  You have rapid weight gain or loss.  You have shortness of breath with chest pain.  You notice sudden or extreme swelling of your face, hands, ankles, feet, or legs.  You have not felt your baby move in over an hour.  You have severe headaches that do not go away with medicine.  You have vision changes.   Document Released: 04/12/2001 Document Revised: 04/23/2013 Document Reviewed: 06/19/2012 [employer] Patient Information 2015 [employer]. This information is not intended to replace advice given to you by your health care provider. Make sure you discuss any questions you have with your health care provider.     

## 2015-10-28 NOTE — Progress Notes (Signed)
Low-risk OB appointment [id number] 7021w6d Estimated Date of Delivery: 02/04/16 BP 100/80 mmHg  Pulse 84  Wt 284 lb (128.822 kg)  LMP 04/04/2015 (Exact Date)  BP, weight, and urine reviewed.  Refer to obstetrical flow sheet for FH & FHR.  Reports good fm.  Denies regular uc's, lof, vb, or uti s/s. No complaints. Concerned about THC use and that police will be at hospital and take her baby. Advised complete THC cessation, and discussed policy of SW talking w/ her after delivery, but if solely THC use and no other concerns for child well-being they generally do not take baby [facility].  Reviewed ptl s/s, fm. Plan:  Continue routine obstetrical care  F/U in 2wks for OB appointment, pn2, and f/u anatomy u/s for limited views

## 2015-10-29 LAB — PMP SCREEN PROFILE (10S), URINE
Amphetamine Screen, Ur: NEGATIVE ng/mL
BENZODIAZEPINE SCREEN, URINE: NEGATIVE ng/mL
Barbiturate Screen, Ur: NEGATIVE ng/mL
CANNABINOIDS UR QL SCN: POSITIVE ng/mL
Cocaine(Metab.)Screen, Urine: NEGATIVE ng/mL
Creatinine(Crt), U: 114.2 mg/dL (ref 20.0–300.0)
Methadone Scn, Ur: NEGATIVE ng/mL
Opiate Scrn, Ur: NEGATIVE ng/mL
Oxycodone+Oxymorphone Ur Ql Scn: NEGATIVE ng/mL
PCP Scrn, Ur: NEGATIVE ng/mL
Ph of Urine: 6.3 (ref 4.5–8.9)
Propoxyphene, Screen: NEGATIVE ng/mL

## 2015-11-02 ENCOUNTER — Encounter: Payer: Self-pay | Admitting: Women's Health

## 2015-11-02 DIAGNOSIS — F1290 Cannabis use, unspecified, uncomplicated: Secondary | ICD-10-CM | POA: Insufficient documentation

## 2015-11-13 ENCOUNTER — Other Ambulatory Visit: Payer: Medicaid Other

## 2015-11-13 ENCOUNTER — Encounter: Payer: Medicaid Other | Admitting: Obstetrics and Gynecology

## 2015-11-20 ENCOUNTER — Other Ambulatory Visit: Payer: Self-pay | Admitting: Women's Health

## 2015-11-20 ENCOUNTER — Ambulatory Visit (INDEPENDENT_AMBULATORY_CARE_PROVIDER_SITE_OTHER): Payer: Medicaid Other | Admitting: Obstetrics and Gynecology

## 2015-11-20 ENCOUNTER — Ambulatory Visit (INDEPENDENT_AMBULATORY_CARE_PROVIDER_SITE_OTHER): Payer: Medicaid Other

## 2015-11-20 ENCOUNTER — Encounter: Payer: Self-pay | Admitting: Obstetrics and Gynecology

## 2015-11-20 ENCOUNTER — Other Ambulatory Visit: Payer: Medicaid Other

## 2015-11-20 VITALS — BP 120/70 | HR 104 | Wt 285.5 lb

## 2015-11-20 DIAGNOSIS — Z3492 Encounter for supervision of normal pregnancy, unspecified, second trimester: Secondary | ICD-10-CM

## 2015-11-20 DIAGNOSIS — Z1389 Encounter for screening for other disorder: Secondary | ICD-10-CM

## 2015-11-20 DIAGNOSIS — O321XX1 Maternal care for breech presentation, fetus 1: Secondary | ICD-10-CM

## 2015-11-20 DIAGNOSIS — O09292 Supervision of pregnancy with other poor reproductive or obstetric history, second trimester: Secondary | ICD-10-CM

## 2015-11-20 DIAGNOSIS — Z363 Encounter for antenatal screening for malformations: Secondary | ICD-10-CM

## 2015-11-20 DIAGNOSIS — IMO0002 Reserved for concepts with insufficient information to code with codable children: Secondary | ICD-10-CM

## 2015-11-20 DIAGNOSIS — Z0489 Encounter for examination and observation for other specified reasons: Secondary | ICD-10-CM

## 2015-11-20 DIAGNOSIS — Z131 Encounter for screening for diabetes mellitus: Secondary | ICD-10-CM

## 2015-11-20 DIAGNOSIS — Z331 Pregnant state, incidental: Secondary | ICD-10-CM

## 2015-11-20 DIAGNOSIS — Z36 Encounter for antenatal screening of mother: Secondary | ICD-10-CM

## 2015-11-20 DIAGNOSIS — Z3A30 30 weeks gestation of pregnancy: Secondary | ICD-10-CM | POA: Diagnosis not present

## 2015-11-20 DIAGNOSIS — Z369 Encounter for antenatal screening, unspecified: Secondary | ICD-10-CM

## 2015-11-20 LAB — POCT URINALYSIS DIPSTICK
Ketones, UA: NEGATIVE
LEUKOCYTES UA: NEGATIVE
NITRITE UA: NEGATIVE
PROTEIN UA: NEGATIVE
RBC UA: NEGATIVE

## 2015-11-20 NOTE — Progress Notes (Signed)
US 29+1 wks,breech,post pl gr 2,normal ov's bilat,fhr 151 bpm,afi 13.4 cm,efw 1337 g,heart anatomy complete,limited view of L/SP because of pt body habitus and fetal pos (appears to be wnl)

## 2015-11-20 NOTE — Progress Notes (Signed)
[id number] 4875w1d Estimated Date of Delivery: 02/04/16 LROB  Patient reports good fetal movement, denies any bleeding and no rupture of membranes symptoms or regular contractions. Patient complaints: no complaints at this time.  Blood pressure 120/70, pulse 104, weight 285 lb 8 oz (129.502 kg), last menstrual period 04/04/2015, unknown if currently breastfeeding.  refer to the ob flow sheet for FH and FHR, also BP, Wt, Urine results: negative except trace glucose                            Physical Examination: General appearance - alert, well appearing, and in no distress and oriented to person, place, and time                                      Abdomen - FH 35 cm ,                                                         -FHR 151 bpm                                                         -otherwise normal                                      Pelvic - not needed at this time                                             Questions were answered. Assessment: LROB [id number] @ 2875w1d   Plan:  Continued routine obstetrical care  F/u in 3 weeks for routine prenatal visit   By signing my name below, I, Annette Zhang, attest that this documentation has been prepared under the direction and in the presence of Tilda BurrowJohn V Briann Sarchet, MD. Electronically Signed: Sonum Zhang, Scribe. 11/20/2015. 10:36 AM.   I personally performed the services described in this documentation, which was SCRIBED in my presence. The recorded information has been reviewed and considered accurate. It has been edited as necessary during review. Tilda BurrowFERGUSON,Nyriah Coote V, MD

## 2015-11-20 NOTE — Progress Notes (Signed)
Pt denies any problems or concerns at this time.  

## 2015-11-21 LAB — GLUCOSE TOLERANCE, 2 HOURS W/ 1HR
GLUCOSE, 1 HOUR: 152 mg/dL (ref 65–179)
GLUCOSE, 2 HOUR: 117 mg/dL (ref 65–152)
Glucose, Fasting: 97 mg/dL — ABNORMAL HIGH (ref 65–91)

## 2015-11-21 LAB — CBC
HEMOGLOBIN: 9.8 g/dL — AB (ref 11.1–15.9)
Hematocrit: 29.8 % — ABNORMAL LOW (ref 34.0–46.6)
MCH: 28 pg (ref 26.6–33.0)
MCHC: 32.9 g/dL (ref 31.5–35.7)
MCV: 85 fL (ref 79–97)
Platelets: 313 10*3/uL (ref 150–379)
RBC: 3.5 x10E6/uL — ABNORMAL LOW (ref 3.77–5.28)
RDW: 14.3 % (ref 12.3–15.4)
WBC: 11.5 10*3/uL — AB (ref 3.4–10.8)

## 2015-11-21 LAB — RPR: RPR Ser Ql: NONREACTIVE

## 2015-11-21 LAB — ANTIBODY SCREEN: ANTIBODY SCREEN: NEGATIVE

## 2015-11-21 LAB — HIV ANTIBODY (ROUTINE TESTING W REFLEX): HIV Screen 4th Generation wRfx: NONREACTIVE

## 2015-11-23 ENCOUNTER — Telehealth: Payer: Self-pay | Admitting: *Deleted

## 2015-11-23 ENCOUNTER — Other Ambulatory Visit: Payer: Self-pay | Admitting: Women's Health

## 2015-11-23 DIAGNOSIS — O24410 Gestational diabetes mellitus in pregnancy, diet controlled: Secondary | ICD-10-CM

## 2015-11-23 MED ORDER — FERROUS SULFATE 325 (65 FE) MG PO TABS: 325.0000 mg | ORAL_TABLET | Freq: Two times a day (BID) | ORAL | 3 refills | Status: AC

## 2015-11-24 NOTE — Telephone Encounter (Signed)
Pt informed per Joellyn Haff, CNM Dx of Gestational Diabetes, referral completed for Nutritionist, if pt does not hear from Nutirtitionist for an appt within 1-2 weeks will need to call our office.   Pt is also anemic, Fe supplement e-scribed to pharmacy, continue to take PNV daily, increased Fe rich foods (red meat, green leafy vegetable, beans, etc.)  Pt verbalized understanding.

## 2015-11-30 ENCOUNTER — Telehealth: Payer: Self-pay | Admitting: *Deleted

## 2015-11-30 ENCOUNTER — Encounter: Payer: Self-pay | Admitting: Women's Health

## 2015-11-30 NOTE — Telephone Encounter (Signed)
Pt now aware of sugar test scheduled.  We had been trying to contact her but her phone number had changed.  She called in today.

## 2015-12-11 ENCOUNTER — Other Ambulatory Visit: Payer: Medicaid Other

## 2015-12-11 ENCOUNTER — Ambulatory Visit (INDEPENDENT_AMBULATORY_CARE_PROVIDER_SITE_OTHER): Payer: Medicaid Other | Admitting: Obstetrics and Gynecology

## 2015-12-11 VITALS — BP 122/76 | HR 108 | Wt 288.0 lb

## 2015-12-11 DIAGNOSIS — O24410 Gestational diabetes mellitus in pregnancy, diet controlled: Secondary | ICD-10-CM

## 2015-12-11 DIAGNOSIS — Z1389 Encounter for screening for other disorder: Secondary | ICD-10-CM

## 2015-12-11 DIAGNOSIS — Z331 Pregnant state, incidental: Secondary | ICD-10-CM

## 2015-12-11 DIAGNOSIS — Z131 Encounter for screening for diabetes mellitus: Secondary | ICD-10-CM

## 2015-12-11 DIAGNOSIS — Z3A32 32 weeks gestation of pregnancy: Secondary | ICD-10-CM

## 2015-12-11 DIAGNOSIS — Z3483 Encounter for supervision of other normal pregnancy, third trimester: Secondary | ICD-10-CM

## 2015-12-11 LAB — POCT URINALYSIS DIPSTICK
Blood, UA: NEGATIVE
GLUCOSE UA: NEGATIVE
KETONES UA: NEGATIVE
LEUKOCYTES UA: NEGATIVE
Nitrite, UA: NEGATIVE
PROTEIN UA: NEGATIVE

## 2015-12-11 NOTE — Progress Notes (Signed)
Patient ID: Jiles HaroldWhitney R Trott, female   DOB: [birth year], 30 y.o.   MRN: [id number]  [id number]  Estimated Date of Delivery: 02/04/16 LROB 1585w1d  Blood pressure 122/76, pulse (!) 108, weight 288 lb (130.6 kg), last menstrual period 04/04/2015, unknown if currently breastfeeding.    Urine results: notable for none  Chief Complaint  Patient presents with  . Routine Prenatal Visit    2 hr GTT    Patient complaints: none. Pt reports she failed her first GTT because she had a hot chocolate right before the test. She reports she is considering BTL after delivery. Pt would like to schedule induction so that she can arrange child care and transportation. She states she would like to be induced the week end of 01/28/16. Limitations in her options regarding induction are discussed  Patient reports good fetal movement. She denies any bleeding, rupture of membranes, or regular contractions.   Refer to the ob flow sheet for FH and FHR.    Physical Examination: General appearance - alert, well appearing, and in no distress                                      Abdomen - FH 38 cm,                                                         -FHR 147 bpm                                                         soft, nontender, nondistended, no masses or organomegaly, fetus vertex                                              Questions were answered. Assessment: LROB [id number] @ 485w1d 1. Repeat GTT2 collected today  Discussed benefits of healthy eating, counting calories, proper serving sizes and apps like [employer]. Advised pt to lose 10% body weight at a time to achieve goal weight after delivery. Discussed the importance of a healthy lifestyle to avoid developing elevated A1C levels later in life. At end of discussion, pt had opportunity to ask questions and has no further questions at this time.   Greater than 50% was spent in counseling and coordination of care with the patient. Total time greater than: 25  minutes   Plan:   Continued routine obstetrical care 1. Pt to pursue healthy eating habits  F/u in 2 weeks for routine prenatal care   By signing my name below, I, Doreatha MartinEva Mathews, attest that this documentation has been prepared under the direction and in the presence of Tilda BurrowJohn V Kynli Chou, MD. Electronically Signed: Doreatha MartinEva Mathews, ED Scribe. 12/11/15. 9:29 AM.  I personally performed the services described in this documentation, which was SCRIBED in my presence. The recorded information has been reviewed and considered accurate. It has been edited as necessary during review. Tilda BurrowFERGUSON,Annsley Akkerman V, MD

## 2015-12-12 LAB — GLUCOSE TOLERANCE, 2 HOURS W/ 1HR
GLUCOSE, 1 HOUR: 146 mg/dL (ref 65–179)
GLUCOSE, 2 HOUR: 108 mg/dL (ref 65–152)
GLUCOSE, FASTING: 85 mg/dL (ref 65–91)

## 2015-12-21 ENCOUNTER — Telehealth: Payer: Self-pay | Admitting: Women's Health

## 2015-12-22 NOTE — Telephone Encounter (Signed)
Unable to reach pt by phone after 3 attempts.

## 2015-12-25 ENCOUNTER — Ambulatory Visit (INDEPENDENT_AMBULATORY_CARE_PROVIDER_SITE_OTHER): Payer: Medicaid Other | Admitting: Obstetrics and Gynecology

## 2015-12-25 ENCOUNTER — Encounter: Payer: Self-pay | Admitting: Obstetrics and Gynecology

## 2015-12-25 ENCOUNTER — Encounter: Payer: Medicaid Other | Admitting: Obstetrics and Gynecology

## 2015-12-25 VITALS — BP 138/74 | HR 72 | Wt 285.2 lb

## 2015-12-25 DIAGNOSIS — Z331 Pregnant state, incidental: Secondary | ICD-10-CM

## 2015-12-25 DIAGNOSIS — Z0283 Encounter for blood-alcohol and blood-drug test: Secondary | ICD-10-CM

## 2015-12-25 DIAGNOSIS — Z3A34 34 weeks gestation of pregnancy: Secondary | ICD-10-CM

## 2015-12-25 DIAGNOSIS — O09893 Supervision of other high risk pregnancies, third trimester: Secondary | ICD-10-CM

## 2015-12-25 DIAGNOSIS — O0993 Supervision of high risk pregnancy, unspecified, third trimester: Secondary | ICD-10-CM

## 2015-12-25 DIAGNOSIS — Z1389 Encounter for screening for other disorder: Secondary | ICD-10-CM

## 2015-12-25 LAB — POCT URINALYSIS DIPSTICK
Glucose, UA: NEGATIVE
KETONES UA: NEGATIVE
Nitrite, UA: NEGATIVE
PROTEIN UA: NEGATIVE

## 2015-12-25 NOTE — Progress Notes (Signed)
Annette Zhang is a 30 y.o. female  [phone]  Estimated Date of Delivery: 02/04/16 LROB 5442w1d  Blood pressure 138/74, pulse 72, weight 285 lb 3.2 oz (129.4 kg), last menstrual period 04/04/2015, unknown if currently breastfeeding.   Urine results:notable for trace leukocytes  Chief Complaint  Patient presents with   Routine Prenatal Visit    pelvic pain when changing positions    Patient has no acute complaints at this time. Patient reports good fetal movement,                          denies any bleeding , rupture of membranes,or regular contractions.   refer to the ob flow sheet for FH and FHR, ,                          Physical Examination: General appearance - alert, well appearing, and in no distress and oriented to person, place, and time                                      Abdomen - FH 37 ,                                                         -FHR 145                                                         soft, nontender,                                       Pelvic - examination not indicated                                            Questions were answered. Assessment: LROB [id number] @ 3042w1d   Plan:  Continued routine obstetrical care, may need social induction around 39+1 weeks. Will discuss with on-call physician, Dr. Alysia PennaErvin  F/u in 2 weeks for routine prenatal visit.     By signing my name below, I, Freida BusmanDiana Omoyeni, attest that this documentation has been prepared under the direction and in the presence of Tilda BurrowJohn V Ferguson, MD . Electronically Signed: Freida Busmaniana Omoyeni, Scribe. 12/25/2015. 1:44 PM. I personally performed the services described in this documentation, which was SCRIBED in my presence. The recorded information has been reviewed and considered accurate. It has been edited as necessary during review. Tilda BurrowFERGUSON,JOHN V, MD

## 2015-12-26 LAB — PMP SCREEN PROFILE (10S), URINE
AMPHETAMINE SCRN UR: NEGATIVE ng/mL
Barbiturate Screen, Ur: NEGATIVE ng/mL
Benzodiazepine Screen, Urine: NEGATIVE ng/mL
CANNABINOIDS UR QL SCN: NEGATIVE ng/mL
CREATININE(CRT), U: 98.6 mg/dL (ref 20.0–300.0)
Cocaine(Metab.)Screen, Urine: NEGATIVE ng/mL
Methadone Scn, Ur: NEGATIVE ng/mL
OXYCODONE+OXYMORPHONE UR QL SCN: NEGATIVE ng/mL
Opiate Scrn, Ur: NEGATIVE ng/mL
PCP SCRN UR: NEGATIVE ng/mL
PH UR, DRUG SCRN: 7.1 (ref 4.5–8.9)
PROPOXYPHENE SCREEN: NEGATIVE ng/mL

## 2015-12-28 ENCOUNTER — Encounter: Payer: Medicaid Other | Admitting: Obstetrics and Gynecology

## 2016-01-07 ENCOUNTER — Ambulatory Visit (INDEPENDENT_AMBULATORY_CARE_PROVIDER_SITE_OTHER): Payer: Medicaid Other | Admitting: Obstetrics & Gynecology

## 2016-01-07 ENCOUNTER — Encounter: Payer: Self-pay | Admitting: Obstetrics & Gynecology

## 2016-01-07 VITALS — BP 122/80 | HR 78 | Wt 286.0 lb

## 2016-01-07 DIAGNOSIS — Z1389 Encounter for screening for other disorder: Secondary | ICD-10-CM

## 2016-01-07 DIAGNOSIS — Z331 Pregnant state, incidental: Secondary | ICD-10-CM

## 2016-01-07 DIAGNOSIS — Z3493 Encounter for supervision of normal pregnancy, unspecified, third trimester: Secondary | ICD-10-CM

## 2016-01-07 LAB — POCT URINALYSIS DIPSTICK
GLUCOSE UA: NEGATIVE
Leukocytes, UA: NEGATIVE
Nitrite, UA: NEGATIVE
RBC UA: NEGATIVE

## 2016-01-07 NOTE — Progress Notes (Signed)
[id number] 4813w0d Estimated Date of Delivery: 02/04/16  Blood pressure 122/80, pulse 78, weight 286 lb (129.7 kg), last menstrual period 04/04/2015, unknown if currently breastfeeding.   BP weight and urine results all reviewed and noted.  Please refer to the obstetrical flow sheet for the fundal height and fetal heart rate documentation:  Patient reports good fetal movement, denies any bleeding and no rupture of membranes symptoms or regular contractions. Patient is without complaints. All questions were answered.  Orders Placed This Encounter  Procedures  . US OB Follow Up  . POCT urinalysis dipstick    Plan:  Continued routine obstetrical care, sonogram next week  No Follow-up on file.

## 2016-01-08 ENCOUNTER — Encounter: Payer: Medicaid Other | Admitting: Obstetrics and Gynecology

## 2016-01-14 ENCOUNTER — Other Ambulatory Visit: Payer: Self-pay | Admitting: Obstetrics & Gynecology

## 2016-01-14 DIAGNOSIS — O24419 Gestational diabetes mellitus in pregnancy, unspecified control: Secondary | ICD-10-CM

## 2016-01-15 ENCOUNTER — Ambulatory Visit (INDEPENDENT_AMBULATORY_CARE_PROVIDER_SITE_OTHER): Payer: Medicaid Other

## 2016-01-15 ENCOUNTER — Ambulatory Visit (INDEPENDENT_AMBULATORY_CARE_PROVIDER_SITE_OTHER): Payer: Medicaid Other | Admitting: Obstetrics & Gynecology

## 2016-01-15 VITALS — BP 130/80 | HR 102 | Wt 291.0 lb

## 2016-01-15 DIAGNOSIS — O09893 Supervision of other high risk pregnancies, third trimester: Secondary | ICD-10-CM

## 2016-01-15 DIAGNOSIS — Z3493 Encounter for supervision of normal pregnancy, unspecified, third trimester: Secondary | ICD-10-CM

## 2016-01-15 DIAGNOSIS — O24419 Gestational diabetes mellitus in pregnancy, unspecified control: Secondary | ICD-10-CM

## 2016-01-15 DIAGNOSIS — Z331 Pregnant state, incidental: Secondary | ICD-10-CM

## 2016-01-15 DIAGNOSIS — Z3A38 38 weeks gestation of pregnancy: Secondary | ICD-10-CM

## 2016-01-15 DIAGNOSIS — O24410 Gestational diabetes mellitus in pregnancy, diet controlled: Secondary | ICD-10-CM

## 2016-01-15 DIAGNOSIS — Z369 Encounter for antenatal screening, unspecified: Secondary | ICD-10-CM

## 2016-01-15 DIAGNOSIS — Z3685 Encounter for antenatal screening for Streptococcus B: Secondary | ICD-10-CM

## 2016-01-15 DIAGNOSIS — Z3483 Encounter for supervision of other normal pregnancy, third trimester: Secondary | ICD-10-CM

## 2016-01-15 DIAGNOSIS — Z1389 Encounter for screening for other disorder: Secondary | ICD-10-CM

## 2016-01-15 LAB — POCT URINALYSIS DIPSTICK
GLUCOSE UA: NEGATIVE
Ketones, UA: NEGATIVE
Leukocytes, UA: NEGATIVE
NITRITE UA: NEGATIVE
PROTEIN UA: NEGATIVE
RBC UA: NEGATIVE

## 2016-01-15 NOTE — Progress Notes (Signed)
[id number] 5239w1d Estimated Date of Delivery: 02/04/16  Blood pressure 130/80, pulse (!) 102, weight 291 lb (132 kg), last menstrual period 04/04/2015, unknown if currently breastfeeding.   BP weight and urine results all reviewed and noted.  Please refer to the obstetrical flow sheet for the fundal height and fetal heart rate documentation:  Patient reports good fetal movement, denies any bleeding and no rupture of membranes symptoms or regular contractions. Patient is without complaints. All questions were answered.  Orders Placed This Encounter  Procedures  . Strep Gp B NAA  . GC/Chlamydia Probe Amp  . POCT urinalysis dipstick    Plan:  Continued routine obstetrical care, GBS done  Return in about 1 week (around 01/22/2016) for LROB.

## 2016-01-15 NOTE — Progress Notes (Signed)
US 37+1 wks,cephalic,post pl gr 3,bilat adnexa's wnl,fhr 152 bpm,afi 11.6 cm,efw 3276 g 65%

## 2016-01-19 LAB — GC/CHLAMYDIA PROBE AMP
Chlamydia trachomatis, NAA: NEGATIVE
NEISSERIA GONORRHOEAE BY PCR: NEGATIVE

## 2016-01-20 ENCOUNTER — Encounter: Payer: Medicaid Other | Admitting: Obstetrics & Gynecology

## 2016-01-20 ENCOUNTER — Telehealth: Payer: Self-pay | Admitting: Obstetrics & Gynecology

## 2016-01-20 LAB — STREP GP B NAA: STREP GROUP B AG: NEGATIVE

## 2016-01-20 NOTE — Telephone Encounter (Signed)
Pt states she loss her mucus plug this am, no vaginal bleeding, +FM, no gush of fluids, no c/o contractions/cramps. Pt informed losing mucus plug is not a sign of labor, discussed labor precautions. Pt to call our office back as needed and has an appt scheduled for 01/25/2016 with Dr.Ferguson. Pt verbalized understanding.

## 2016-01-25 ENCOUNTER — Other Ambulatory Visit: Payer: Self-pay | Admitting: Obstetrics and Gynecology

## 2016-01-25 ENCOUNTER — Ambulatory Visit (INDEPENDENT_AMBULATORY_CARE_PROVIDER_SITE_OTHER): Payer: Medicaid Other | Admitting: Obstetrics and Gynecology

## 2016-01-25 VITALS — BP 124/78 | HR 104 | Wt 291.8 lb

## 2016-01-25 DIAGNOSIS — O24410 Gestational diabetes mellitus in pregnancy, diet controlled: Secondary | ICD-10-CM

## 2016-01-25 DIAGNOSIS — Z1389 Encounter for screening for other disorder: Secondary | ICD-10-CM

## 2016-01-25 DIAGNOSIS — O09893 Supervision of other high risk pregnancies, third trimester: Secondary | ICD-10-CM

## 2016-01-25 DIAGNOSIS — Z331 Pregnant state, incidental: Secondary | ICD-10-CM

## 2016-01-25 DIAGNOSIS — O09293 Supervision of pregnancy with other poor reproductive or obstetric history, third trimester: Secondary | ICD-10-CM | POA: Diagnosis not present

## 2016-01-25 DIAGNOSIS — O365931 Maternal care for other known or suspected poor fetal growth, third trimester, fetus 1: Secondary | ICD-10-CM

## 2016-01-25 DIAGNOSIS — Z3A39 39 weeks gestation of pregnancy: Secondary | ICD-10-CM | POA: Diagnosis not present

## 2016-01-25 LAB — POCT URINALYSIS DIPSTICK
Blood, UA: NEGATIVE
Glucose, UA: NEGATIVE
KETONES UA: NEGATIVE
Leukocytes, UA: NEGATIVE
Nitrite, UA: NEGATIVE

## 2016-01-25 NOTE — Progress Notes (Signed)
[id number]  Estimated Date of Delivery: 02/04/16 LROB 7757w4d who again requests a scheduled IOL due to social and family support factors. [place], and has support from one side of [place], and will be able to care for her multiple children if delivery is scheduled. Pelvis is tested to greater thatn 10 lb, and this infant is smaller by recent u/s 9/15 when baby WAS 3276 GM, 65%ILE FOR GEST AGE AT TIME OF U/S.   Blood pressure 124/78, pulse (!) 104, weight 291 lb 12.8 oz (132.4 kg), last menstrual period 04/04/2015, unknown if currently breastfeeding.  Having good FM and occasional contractions.  Urine results:notable for trace protein  Chief Complaint  Patient presents with  . High Risk Gestation    would like to discuss induction    Patient complaints:none at this time. Patient reports  good fetal movement,                           denies any bleeding , rupture of membranes,or regular contractions.   refer to the ob flow sheet for FH and FHR, ,                          Physical Examination: General appearance - alert, well appearing, and in no distress and oriented to person, place, and time                                      Abdomen - FH 37 cm/ U+15 ,                                                         -FHR 154                                                       Pelvic - cervix is 2 cm 20/ midposition.                                            Questions were answered. Assessment: LROB [id number] @ 7357w4d , requesting IOL at 39+ wk due to poor support team and distance from relatives. BC- interval tubal Breast feeding   Plan:  Continued routine obstetrical care, IOL on 01/30/16 AT 00:00.  F/u in 4 weeks for postpartum visit   By signing my name below, I, Sonum Patel, attest that this documentation has been prepared under the direction and in the presence of Tilda BurrowJohn V Tameca Jerez, MD. Electronically Signed: Sonum Patel, Neurosurgeoncribe. 01/25/16. 2:05  PM.  I personally performed the services described in this documentation, which was SCRIBED in my presence. The recorded information has been reviewed and considered accurate. It has been edited as necessary during review. Tilda BurrowFERGUSON,Percell Lamboy V, MD

## 2016-01-26 ENCOUNTER — Encounter: Payer: Medicaid Other | Admitting: Obstetrics & Gynecology

## 2016-01-28 ENCOUNTER — Telehealth (HOSPITAL_COMMUNITY): Payer: Self-pay | Admitting: *Deleted

## 2016-01-28 ENCOUNTER — Encounter (HOSPITAL_COMMUNITY): Payer: Self-pay | Admitting: *Deleted

## 2016-01-28 ENCOUNTER — Telehealth: Payer: Self-pay | Admitting: *Deleted

## 2016-01-28 NOTE — Telephone Encounter (Signed)
Pt informed will not be able to schedule her induction tonight due to L&D schedule per Cathie BeamsFran Cresenzo-Dishmon, CNM. Pt also [facility] on [facility]/2016 and see if they still want her to come at the scheduled time or another time. Pt verbalized understanding.

## 2016-01-28 NOTE — Telephone Encounter (Signed)
Preadmission screen  

## 2016-01-29 ENCOUNTER — Telehealth: Payer: Self-pay | Admitting: Obstetrics and Gynecology

## 2016-01-29 NOTE — Telephone Encounter (Signed)
Unable to reach pt by phone at home or mobile. Home number not in service,and mobile has no messaging set up

## 2016-01-31 ENCOUNTER — Inpatient Hospital Stay (HOSPITAL_COMMUNITY)
Admission: RE | Admit: 2016-01-31 | Discharge: 2016-02-01 | DRG: 775 | Disposition: A | Discharge status: Home / Self Care | Payer: Medicaid Other | Source: Ambulatory Visit | Attending: Obstetrics & Gynecology | Admitting: Obstetrics & Gynecology

## 2016-01-31 ENCOUNTER — Inpatient Hospital Stay (HOSPITAL_COMMUNITY): Payer: Medicaid Other | Admitting: Anesthesiology

## 2016-01-31 ENCOUNTER — Encounter (HOSPITAL_COMMUNITY): Payer: Self-pay

## 2016-01-31 DIAGNOSIS — O99324 Drug use complicating childbirth: Secondary | ICD-10-CM | POA: Diagnosis not present

## 2016-01-31 DIAGNOSIS — Z6841 Body Mass Index (BMI) 40.0 and over, adult: Secondary | ICD-10-CM | POA: Diagnosis not present

## 2016-01-31 DIAGNOSIS — Z3483 Encounter for supervision of other normal pregnancy, third trimester: Secondary | ICD-10-CM

## 2016-01-31 DIAGNOSIS — Z3490 Encounter for supervision of normal pregnancy, unspecified, unspecified trimester: Secondary | ICD-10-CM

## 2016-01-31 DIAGNOSIS — D649 Anemia, unspecified: Secondary | ICD-10-CM | POA: Diagnosis present

## 2016-01-31 DIAGNOSIS — Z3A39 39 weeks gestation of pregnancy: Secondary | ICD-10-CM

## 2016-01-31 DIAGNOSIS — F1290 Cannabis use, unspecified, uncomplicated: Secondary | ICD-10-CM | POA: Diagnosis present

## 2016-01-31 DIAGNOSIS — Z87891 Personal history of nicotine dependence: Secondary | ICD-10-CM

## 2016-01-31 DIAGNOSIS — O9902 Anemia complicating childbirth: Principal | ICD-10-CM | POA: Diagnosis present

## 2016-01-31 DIAGNOSIS — O99214 Obesity complicating childbirth: Secondary | ICD-10-CM | POA: Diagnosis present

## 2016-01-31 LAB — CBC
HEMATOCRIT: 32.1 % — AB (ref 36.0–46.0)
HEMOGLOBIN: 10.2 g/dL — AB (ref 12.0–15.0)
MCH: 27.2 pg (ref 26.0–34.0)
MCHC: 31.8 g/dL (ref 30.0–36.0)
MCV: 85.6 fL (ref 78.0–100.0)
Platelets: 298 10*3/uL (ref 150–400)
RBC: 3.75 MIL/uL — AB (ref 3.87–5.11)
RDW: 16.5 % — ABNORMAL HIGH (ref 11.5–15.5)
WBC: 12 10*3/uL — ABNORMAL HIGH (ref 4.0–10.5)

## 2016-01-31 LAB — RPR: RPR: NONREACTIVE

## 2016-01-31 LAB — TYPE AND SCREEN
ABO/RH(D): AB POS
Antibody Screen: NEGATIVE

## 2016-01-31 LAB — RAPID URINE DRUG SCREEN, HOSP PERFORMED
AMPHETAMINES: NOT DETECTED
BENZODIAZEPINES: NOT DETECTED
Barbiturates: NOT DETECTED
COCAINE: NOT DETECTED
OPIATES: NOT DETECTED
Tetrahydrocannabinol: NOT DETECTED

## 2016-01-31 LAB — ABO/RH: ABO/RH(D): AB POS

## 2016-01-31 MED ORDER — HYDROXYZINE HCL 50 MG PO TABS
50.0000 mg | ORAL_TABLET | Freq: Four times a day (QID) | ORAL | Status: DC | PRN
  Filled 2016-01-31: qty 1

## 2016-01-31 MED ORDER — PHENYLEPHRINE 40 MCG/ML (10ML) SYRINGE FOR IV PUSH (FOR BLOOD PRESSURE SUPPORT)
80.0000 ug | PREFILLED_SYRINGE | INTRAVENOUS | Status: DC | PRN
  Filled 2016-01-31: qty 5

## 2016-01-31 MED ORDER — OXYTOCIN BOLUS FROM INFUSION: 500.0000 mL | Freq: Once | INTRAVENOUS | Status: DC

## 2016-01-31 MED ORDER — BENZOCAINE-MENTHOL 20-0.5 % EX AERO: 1.0000 "application " | INHALATION_SPRAY | CUTANEOUS | Status: DC | PRN

## 2016-01-31 MED ORDER — PRENATAL MULTIVITAMIN CH
1.0000 | ORAL_TABLET | Freq: Every day | ORAL | Status: DC
  Administered 2016-02-01: 1 via ORAL
  Filled 2016-01-31: qty 1

## 2016-01-31 MED ORDER — COCONUT OIL OIL
1.0000 | TOPICAL_OIL | Status: DC | PRN
Start: 2016-01-31 — End: 2016-02-01

## 2016-01-31 MED ORDER — OXYTOCIN 40 UNITS IN LACTATED RINGERS INFUSION - SIMPLE MED
1.0000 m[IU]/min | INTRAVENOUS | Status: DC
  Administered 2016-01-31: 2 m[IU]/min via INTRAVENOUS

## 2016-01-31 MED ORDER — LACTATED RINGERS IV SOLN: 500.0000 mL | INTRAVENOUS | Status: DC | PRN

## 2016-01-31 MED ORDER — FENTANYL CITRATE (PF) 100 MCG/2ML IJ SOLN: 50.0000 ug | INTRAMUSCULAR | Status: DC | PRN

## 2016-01-31 MED ORDER — EPHEDRINE 5 MG/ML INJ: 10.0000 mg | INTRAVENOUS | Status: DC | PRN

## 2016-01-31 MED ORDER — DIPHENHYDRAMINE HCL 50 MG/ML IJ SOLN: 12.5000 mg | INTRAMUSCULAR | Status: DC | PRN

## 2016-01-31 MED ORDER — FLEET ENEMA 7-19 GM/118ML RE ENEM: 1.0000 | ENEMA | RECTAL | Status: DC | PRN

## 2016-01-31 MED ORDER — ACETAMINOPHEN 325 MG PO TABS
650.0000 mg | ORAL_TABLET | ORAL | Status: DC | PRN
  Administered 2016-02-01: 325 mg via ORAL
  Filled 2016-01-31 (×2): qty 2

## 2016-01-31 MED ORDER — OXYTOCIN 40 UNITS IN LACTATED RINGERS INFUSION - SIMPLE MED
2.5000 [IU]/h | INTRAVENOUS | Status: DC
  Filled 2016-01-31: qty 1000

## 2016-01-31 MED ORDER — IBUPROFEN 600 MG PO TABS
600.0000 mg | ORAL_TABLET | Freq: Four times a day (QID) | ORAL | Status: DC
  Administered 2016-01-31 – 2016-02-01 (×4): 600 mg via ORAL
  Filled 2016-01-31 (×4): qty 1

## 2016-01-31 MED ORDER — LIDOCAINE HCL (PF) 1 % IJ SOLN
30.0000 mL | INTRAMUSCULAR | Status: DC | PRN
  Filled 2016-01-31: qty 30

## 2016-01-31 MED ORDER — PHENYLEPHRINE 40 MCG/ML (10ML) SYRINGE FOR IV PUSH (FOR BLOOD PRESSURE SUPPORT)
80.0000 ug | PREFILLED_SYRINGE | INTRAVENOUS | Status: DC | PRN
Start: 2016-01-31 — End: 2016-01-31

## 2016-01-31 MED ORDER — ONDANSETRON HCL 4 MG/2ML IJ SOLN: 4.0000 mg | Freq: Four times a day (QID) | INTRAMUSCULAR | Status: DC | PRN

## 2016-01-31 MED ORDER — SIMETHICONE 80 MG PO CHEW: 80.0000 mg | CHEWABLE_TABLET | ORAL | Status: DC | PRN

## 2016-01-31 MED ORDER — ONDANSETRON HCL 4 MG/2ML IJ SOLN: 4.0000 mg | INTRAMUSCULAR | Status: DC | PRN

## 2016-01-31 MED ORDER — PHENYLEPHRINE 40 MCG/ML (10ML) SYRINGE FOR IV PUSH (FOR BLOOD PRESSURE SUPPORT): 80.0000 ug | PREFILLED_SYRINGE | INTRAVENOUS | Status: DC | PRN

## 2016-01-31 MED ORDER — OXYCODONE-ACETAMINOPHEN 5-325 MG PO TABS: 2.0000 | ORAL_TABLET | ORAL | Status: DC | PRN

## 2016-01-31 MED ORDER — EPHEDRINE 5 MG/ML INJ
10.0000 mg | INTRAVENOUS | Status: DC | PRN
  Filled 2016-01-31: qty 4

## 2016-01-31 MED ORDER — LIDOCAINE HCL (PF) 1 % IJ SOLN
INTRAMUSCULAR | Status: DC | PRN
  Administered 2016-01-31: 4 mL via EPIDURAL
  Administered 2016-01-31: 6 mL via EPIDURAL

## 2016-01-31 MED ORDER — OXYCODONE-ACETAMINOPHEN 5-325 MG PO TABS
1.0000 | ORAL_TABLET | ORAL | Status: DC | PRN
  Administered 2016-01-31: 1 via ORAL
  Filled 2016-01-31: qty 1

## 2016-01-31 MED ORDER — TERBUTALINE SULFATE 1 MG/ML IJ SOLN
0.2500 mg | Freq: Once | INTRAMUSCULAR | Status: DC | PRN
  Filled 2016-01-31: qty 1

## 2016-01-31 MED ORDER — LACTATED RINGERS IV SOLN
INTRAVENOUS | Status: DC
  Administered 2016-01-31: 150 mL via INTRAUTERINE

## 2016-01-31 MED ORDER — LACTATED RINGERS IV SOLN
500.0000 mL | Freq: Once | INTRAVENOUS | Status: AC
  Administered 2016-01-31: 500 mL via INTRAVENOUS

## 2016-01-31 MED ORDER — ZOLPIDEM TARTRATE 5 MG PO TABS: 5.0000 mg | ORAL_TABLET | Freq: Every evening | ORAL | Status: DC | PRN

## 2016-01-31 MED ORDER — FENTANYL 2.5 MCG/ML BUPIVACAINE 1/10 % EPIDURAL INFUSION (WH - ANES)
14.0000 mL/h | INTRAMUSCULAR | Status: DC | PRN
  Administered 2016-01-31 (×2): 14 mL/h via EPIDURAL
  Filled 2016-01-31: qty 125

## 2016-01-31 MED ORDER — ONDANSETRON HCL 4 MG PO TABS: 4.0000 mg | ORAL_TABLET | ORAL | Status: DC | PRN

## 2016-01-31 MED ORDER — LACTATED RINGERS IV SOLN: 500.0000 mL | Freq: Once | INTRAVENOUS | Status: DC

## 2016-01-31 MED ORDER — LACTATED RINGERS IV SOLN
INTRAVENOUS | Status: DC
  Administered 2016-01-31 (×2): via INTRAVENOUS

## 2016-01-31 MED ORDER — TETANUS-DIPHTH-ACELL PERTUSSIS 5-2.5-18.5 LF-MCG/0.5 IM SUSP: 0.5000 mL | Freq: Once | INTRAMUSCULAR | Status: DC

## 2016-01-31 MED ORDER — ACETAMINOPHEN 325 MG PO TABS: 650.0000 mg | ORAL_TABLET | ORAL | Status: DC | PRN

## 2016-01-31 MED ORDER — DIPHENHYDRAMINE HCL 25 MG PO CAPS: 25.0000 mg | ORAL_CAPSULE | Freq: Four times a day (QID) | ORAL | Status: DC | PRN

## 2016-01-31 MED ORDER — SOD CITRATE-CITRIC ACID 500-334 MG/5ML PO SOLN: 30.0000 mL | ORAL | Status: DC | PRN

## 2016-01-31 MED ORDER — SENNOSIDES-DOCUSATE SODIUM 8.6-50 MG PO TABS
2.0000 | ORAL_TABLET | ORAL | Status: DC
  Administered 2016-02-01: 2 via ORAL
  Filled 2016-01-31: qty 2

## 2016-01-31 NOTE — Anesthesia Postprocedure Evaluation (Signed)
Anesthesia Post Note  Patient: Annette Zhang  Procedure(s) Performed: * No procedures listed *  Patient location during evaluation: Mother Baby Anesthesia Type: Epidural Level of consciousness: awake, awake and alert and oriented Pain management: pain level controlled Vital Signs Assessment: post-procedure vital signs reviewed and stable Respiratory status: spontaneous breathing, nonlabored ventilation and respiratory function stable Cardiovascular status: stable Postop Assessment: no backache, no headache, patient able to bend at knees, no signs of nausea or vomiting and adequate PO intake Anesthetic complications: no     Last Vitals:  Vitals:   01/31/16 1620 01/31/16 1720  BP: (!) 149/88 127/81  Pulse: 92 96  Resp: 18 18  Temp: 36.8 C 36.7 C    Last Pain:  Vitals:   01/31/16 1720  TempSrc: Oral  PainSc: 0-No pain   Pain Goal:                 [name]

## 2016-01-31 NOTE — Anesthesia Pain Management Evaluation Note (Signed)
  CRNA Pain Management Visit Note  Patient: Annette Zhang, 30 y.o., female  "Hello [facility]. We have an anesthesia team available at all times to provide care throughout the hospital, including epidural management and anesthesia for C-section. I don't know your plan for the delivery whether it a natural birth, water birth, IV sedation, nitrous supplementation, doula or epidural, but we want to meet your pain goals."   1.Was your pain managed to your expectations on prior hospitalizations?   Yes   2.What is your expectation for pain management during this hospitalization?     Epidural  3.How can we help you reach that goal? unsure  Record the patient's initial score and the patient's pain goal.   Pain: 2  Pain Goal: 6 [facility] wants you to be able to say your pain was always managed very well.  Cephus ShellingBURGER,Charlett Merkle 01/31/2016

## 2016-01-31 NOTE — Anesthesia Preprocedure Evaluation (Addendum)
Anesthesia Evaluation   Patient awake    Reviewed: Allergy & Precautions, NPO status , Patient's Chart, lab work & pertinent test results  History of Anesthesia Complications Negative for: history of anesthetic complications  Airway Mallampati: III  TM Distance: >3 FB Neck ROM: Full    Dental  (+) Teeth Intact   Pulmonary neg shortness of breath, neg sleep apnea, neg COPD, neg recent URI, former smoker,    Pulmonary exam normal breath sounds clear to auscultation       Cardiovascular negative cardio ROS   Rhythm:Regular Rate:Normal     Neuro/Psych negative neurological ROS     GI/Hepatic negative GI ROS, Neg liver ROS,   Endo/Other  Morbid obesity  Renal/GU negative Renal ROS     Musculoskeletal   Abdominal (+) + obese,   Peds negative pediatric ROS (+)  Hematology  (+) Blood dyscrasia, anemia ,   Anesthesia Other Findings   Reproductive/Obstetrics (+) Pregnancy                            Anesthesia Physical Anesthesia Plan  ASA: III  Anesthesia Plan: Epidural   Post-op Pain Management:    Induction:   Airway Management Planned: Natural Airway  Additional Equipment:   Intra-op Plan:   Post-operative Plan:   Informed Consent: I have reviewed the patients History and Physical, chart, labs and discussed the procedure including the risks, benefits and alternatives for the proposed anesthesia with the patient or authorized representative who has indicated his/her understanding and acceptance.     Plan Discussed with:   Anesthesia Plan Comments: (I have discussed risks of neuraxial anesthesia including but not limited to infection, bleeding, nerve injury, back pain, headache, seizures, and failure of block. Patient denies bleeding disorders and is not currently anticoagulated. Labs have been reviewed. Risks and benefits discussed. All patient's questions answered.    Platelets 298)        Anesthesia Quick Evaluation

## 2016-01-31 NOTE — H&P (Signed)
Annette Zhang is a 30 y.o. [phone] female at [redacted]w[redacted]d by 18wk u/s, presenting for elective term IOL d/t grand multiparity.   Reports active fetal movement, contractions: none, vaginal bleeding: none, membranes: intact. Initiated prenatal care at [facility] at 20.6 wks.   Most recent u/s 9/15 @ 37.1wks, efw 65%/3276g, afi 11.6cm.   This pregnancy complicated by: +THC, Anemia on Fe, increased r/f T21 1:263 w/ normal cfDNA  Prenatal History/Complications:  Term uncomplicated svb x 6, last baby weighed 11lb3oz w/o dystocia, no GDM SAB x1  Past Medical History: Past Medical History:  Diagnosis Date  . Hx of chlamydia infection   . Pregnant 01/10/2013   To adopt baby out    Past Surgical History: Past Surgical History:  Procedure Laterality Date  . CHOLECYSTECTOMY      Obstetrical History: OB History    Gravida Para Term Preterm AB Living   8 6 6  0 1 6   SAB TAB Ectopic Multiple Live Births   1 0 0 0 6      Social History: Social History   Social History  . Marital status: Single    Spouse name: N/A  . Number of children: N/A  . Years of education: N/A   Social History Main Topics  . Smoking status: Former Smoker    Types: Cigarettes  . Smokeless tobacco: Never Used  . Alcohol use No  . Drug use: No  . Sexual activity: Not Currently    Birth control/ protection: None   Other Topics Concern  . None   Social History Narrative  . None    Family History: Family History  Problem Relation Age of Onset  . Cancer Maternal Grandmother     breast and cervical  . Cancer Maternal Aunt     breast    Allergies: No Known Allergies  Prescriptions Prior to Admission  Medication Sig Dispense Refill Last Dose  . ferrous sulfate 325 (65 FE) MG tablet Take 1 tablet (325 mg total) by mouth 2 (two) times daily with a meal. 60 tablet 3 Past Week at Unknown time  . prenatal vitamin w/FE, FA (PRENATAL 1 + 1) 27-1 MG TABS tablet Take 1 tablet by mouth daily at 12 noon. 30 each 12  01/30/2016 at Unknown time    Review of Systems  Pertinent pos/neg as indicated in HPI  Blood pressure 111/61, pulse (!) 116, temperature 98.8 F (37.1 C), temperature source Oral, resp. rate 18, height 5\' 4"  (1.626 m), weight 132 kg (291 lb), last menstrual period 04/04/2015, unknown if currently breastfeeding. General appearance: alert, cooperative and no distress Lungs: clear to auscultation bilaterally Heart: regular rate and rhythm Abdomen: gravid, soft, non-tender Extremities: no edema DTR's 2+  Fetal monitoring: FHR: 140 bpm, variability: moderate,  Accelerations: Present,  decelerations:  Absent Uterine activity: none Dilation: 2.5 Effacement (%): 50 Station: Ballotable Exam by:: Joellyn Haff CNM  Presentation: cephalic EFW ~9.5lbs  Prenatal labs: ABO, Rh: AB/Positive/-- (05/03 0000) Antibody: Negative (07/21 0903) Rubella: !Error! RPR: Non Reactive (07/21 0903)  HBsAg: Negative (05/03 0000)  HIV: Non Reactive (07/21 0903)  GBS: Negative (09/18 1300)   2 hr GTT: initial 97/152/117- admitted to drinking hot chocolate right before, repeated and was normal 85/146/108 Genetic screening:  DSR 1:263, normal cfDNA Anatomy US: female, limited views spine x 2, but appears normal  No results found for this or any previous visit (from the past 24 hour(s)).   Assessment:  [redacted]w[redacted]d SIUP  [id number]  Elective term IOL d/t  grand multiparity  H/O macrosomia  Increased DSR, normal cfDNA  Cat 1 FHR  THC use  GBS Negative (09/18 1300)  Plan:  Admit to [facility]  IV pain meds/epidural prn active labor  Pitocin per protocol  Anticipate NSVB   Plans to bottlefeed  Contraception: interval salpingectomy-consent not signed  Circumcision: n/a  Repeat uds  Annette Zhang, Annette Zhang CNM, WHNP-BC 01/31/2016, 12:49 AM

## 2016-01-31 NOTE — Anesthesia Procedure Notes (Signed)
Epidural Patient location during procedure: OB Start time: 01/31/2016 10:50 AM End time: 01/31/2016 11:00 AM  Staffing Anesthesiologist: Linton RumpALLAN, Everley Evora DICKERSON Performed: anesthesiologist   Preanesthetic Checklist Completed: patient identified, surgical consent, pre-op evaluation, timeout performed, IV checked, risks and benefits discussed and monitors and equipment checked  Epidural Patient position: sitting Prep: site prepped and draped and DuraPrep Patient monitoring: continuous pulse ox and blood pressure Approach: midline Location: L2-L3 Injection technique: LOR air  Needle:  Needle type: Tuohy  Needle gauge: 17 G Needle length: 9 cm and 9 Needle insertion depth: 8 cm Catheter type: closed end flexible Catheter size: 19 Gauge Catheter at skin depth: 13 cm Test dose: negative  Assessment Events: blood not aspirated, injection not painful, no injection resistance, negative IV test and no paresthesia

## 2016-01-31 NOTE — Progress Notes (Signed)
Annette Zhang is a 30 y.o. [id number] at 4222w3d by ultrasound admitted for induction of labor due to Elective at term.  Subjective:   Objective: BP 118/74   Pulse 87   Temp 98.1 F (36.7 C)   Resp 18   Ht 5\' 4"  (1.626 m)   Wt 291 lb (132 kg)   LMP 04/04/2015 (Exact Date)   BMI 49.95 kg/m  No intake/output data recorded. No intake/output data recorded.  FHT:  FHR: 140's bpm, variability: moderate,  accelerations:  Present,  decelerations:  Absent UC:   regular, every 3-4 minutes SVE:   Dilation: 5.5 Effacement (%): 60, 70 Station: -2, -3 Exam by:: Morrie SheldonAshley, CNM  Labs: Lab Results  Component Value Date   WBC 12.0 (H) 01/31/2016   HGB 10.2 (L) 01/31/2016   HCT 32.1 (L) 01/31/2016   MCV 85.6 01/31/2016   PLT 298 01/31/2016    Assessment / Plan: Induction of labor due to term with favorable cervix,  progressing well on pitocin  Labor: Progressing normally Preeclampsia:  no signs or symptoms of toxicity, intake and ouput balanced and labs stable Fetal Wellbeing:  Category I Pain Control:  Epidural I/D:  n/a Anticipated MOD:  NSVD  Annette Zhang 01/31/2016, 11:53 AM

## 2016-01-31 NOTE — Progress Notes (Signed)
Patient ID: Annette Zhang, female   DOB: [birth year], 30 y.o.   MRN: [id number]  Annette Zhang is a 30 y.o. [id number] at [redacted]w[redacted]d admitted for induction of labor due to Elective at term.  Subjective: Doing well, no complaints  Objective: BP 128/77   Pulse 94   Temp 98 F (36.7 C)   Resp 18   Ht 5\' 4"  (1.626 m)   Wt 132 kg (291 lb)   LMP 04/04/2015 (Exact Date)   BMI 49.95 kg/m  No intake/output data recorded.  FHT:  FHR: 135 bpm, variability: moderate,  accelerations:  Present,  decelerations:  Absent UC:   irregular, every 2-5 minutes  SVE:   Dilation: 4 Effacement (%): 60, 70 Station: -3 Exam by:: S Nix RN  Pitocin @ 8 mu/min  Labs: Lab Results  Component Value Date   WBC 12.0 (H) 01/31/2016   HGB 10.2 (L) 01/31/2016   HCT 32.1 (L) 01/31/2016   MCV 85.6 01/31/2016   PLT 298 01/31/2016    Assessment / Plan: Induction of labor due to elective @ term,  progressing well on pitocin  Labor: Progressing normally Fetal Wellbeing:  Category I Pain Control:  n/a Pre-eclampsia: n/a I/D:  n/a Anticipated MOD:  NSVD  Marge DuncansBooker, Duaine Radin Randall CNM, WHNP-BC 01/31/2016, 9:06 AM

## 2016-02-01 LAB — CBC
HCT: 28.3 % — ABNORMAL LOW (ref 36.0–46.0)
Hemoglobin: 9.1 g/dL — ABNORMAL LOW (ref 12.0–15.0)
MCH: 27.4 pg (ref 26.0–34.0)
MCHC: 32.2 g/dL (ref 30.0–36.0)
MCV: 85.2 fL (ref 78.0–100.0)
PLATELETS: 271 10*3/uL (ref 150–400)
RBC: 3.32 MIL/uL — ABNORMAL LOW (ref 3.87–5.11)
RDW: 16.2 % — AB (ref 11.5–15.5)
WBC: 13.7 10*3/uL — ABNORMAL HIGH (ref 4.0–10.5)

## 2016-02-01 MED ORDER — SENNOSIDES-DOCUSATE SODIUM 8.6-50 MG PO TABS: 2.0000 | ORAL_TABLET | ORAL | 1 refills | Status: AC

## 2016-02-01 MED ORDER — IBUPROFEN 600 MG PO TABS: 600.0000 mg | ORAL_TABLET | Freq: Four times a day (QID) | ORAL | 0 refills | Status: AC

## 2016-02-01 NOTE — Discharge Summary (Signed)
OB Discharge Summary  Patient Name: Annette Zhang DOB: [birth year] MRN: [id number]  Date of admission: 01/31/2016 Delivering MD: Renetta Chalk H   Date of discharge: 02/01/2016  Admitting diagnosis: 39wks, induction Intrauterine pregnancy: [redacted]w[redacted]d     Secondary diagnosis:Active Problems:   Term pregnancy  Additional problems:none     Discharge diagnosis: Term Pregnancy Delivered                                                                     Post partum procedures:none  Augmentation: Pitocin  Complications: None  Hospital course:  Onset of Labor With Vaginal Delivery     30 y.o. yo [id number] at [redacted]w[redacted]d was admitted in Active Labor on 01/31/2016. Patient had an uncomplicated labor course as follows:  Membrane Rupture Time/Date: 10:30 AM ,01/31/2016   Intrapartum Procedures: Episiotomy: None [1]                                         Lacerations:  None [1]  Patient had a delivery of a Viable infant. 01/31/2016  Information for the patient's newborn:  Annette, Zhang [[id number]]  Delivery Method: Vaginal, Spontaneous Delivery (Filed from Delivery Summary)    Pateint had an uncomplicated postpartum course.  She is ambulating, tolerating a regular diet, passing flatus, and urinating well. Patient is discharged home in stable condition on 02/01/16.    Physical exam Vitals:   01/31/16 1536 01/31/16 1620 01/31/16 1720 02/01/16 0553  BP: 132/74 (!) 149/88 127/81 120/87  Pulse: 87 92 96 92  Resp: 18 18 18 20   Temp:  98.2 F (36.8 C) 98 F (36.7 C) 97.7 F (36.5 C)  TempSrc:  Oral Oral Oral  SpO2:  100% 100%   Weight:      Height:       General: alert, cooperative and no distress Lochia: appropriate Uterine Fundus: firm Incision: N/A DVT Evaluation: No evidence of DVT seen on physical exam. Labs: Lab Results  Component Value Date   WBC 13.7 (H) 02/01/2016   HGB 9.1 (L) 02/01/2016   HCT 28.3 (L) 02/01/2016   MCV 85.2 02/01/2016   PLT 271  02/01/2016   CMP Latest Ref Rng & Units 06/22/2010  Glucose 70 - 99 mg/dL 130(Q)  BUN 6 - 23 mg/dL 6  Creatinine 0.4 - 1.2 mg/dL 6.57  Sodium 846 - 962 mEq/L 137  Potassium 3.5 - 5.1 mEq/L 4.0  Chloride 96 - 112 mEq/L 105  CO2 19 - 32 mEq/L 22  Calcium 8.4 - 10.5 mg/dL 9.2  Total Protein 6.0 - 8.3 g/dL 7.9  Total Bilirubin 0.3 - 1.2 mg/dL 0.5  Alkaline Phos 39 - 117 U/L 81  AST 0 - 37 U/L 15  ALT 0 - 35 U/L 12    Discharge instruction: per After Visit Summary and "Baby and Me Booklet".  After Visit Meds:    Medication List    TAKE these medications   ferrous sulfate 325 (65 FE) MG tablet Take 1 tablet (325 mg total) by mouth 2 (two) times daily with a meal.   ibuprofen 600 MG tablet Commonly known as:  ADVIL,MOTRIN  Take 1 tablet (600 mg total) by mouth every 6 (six) hours.   prenatal vitamin w/FE, FA 27-1 MG Tabs tablet Take 1 tablet by mouth daily at 12 noon.   senna-docusate 8.6-50 MG tablet Commonly known as:  Senokot-S Take 2 tablets by mouth daily. Start taking on:  02/02/2016       Diet: routine diet  Activity: Advance as tolerated. Pelvic rest for 6 weeks.   Outpatient follow up:6 weeks Follow up Appt:No future appointments. Follow up visit: No Follow-up on file.  Postpartum contraception: Undecided  Newborn Data: Live born female  Birth Weight: 7 lb 12.9 oz (3541 g) APGAR: 9, 9  Baby Feeding: Breast Disposition:home with mother   02/01/2016 Annette Zhang, CNM

## 2016-02-01 NOTE — Clinical Social Work Maternal (Signed)
  CLINICAL SOCIAL WORK MATERNAL/CHILD NOTE  Patient Details  Name: Annette Zhang MRN: [id number] Date of Birth: 1985-09-19  Date:  02/01/2016  Clinical Social Worker Initiating Note:  Laurey Arrow Date/ Time Initiated:  02/01/16/1104     Child's Name:  Annette Zhang   Legal Guardian:  Mother   Need for Interpreter:  None   Date of Referral:  02/01/16     Reason for Referral:  Current Substance Use/Substance Use During Pregnancy    Referral Source:  Central Nursery   Address:  [address] [place], [place] [address]  Phone number:  [phone]   Household Members:  Minor Children, Significant Other   Natural Supports (not living in the home):  Children, Spouse/significant other, Immediate Family, Extended Family   Professional Supports: None   Employment:     Type of Work:     Education:  9 to 11 years   Pensions consultant:  [employer]   Other Resources:  [employer], Physicist, medical    Cultural/Religious Considerations Which May Impact Care:  Per [employer] Sheet, MOB is Non-Denominational  Strengths:  Ability to meet basic needs , Pediatrician chosen , Home prepared for child    Risk Factors/Current Problems:      Cognitive State:  Alert , Linear Thinking , Insightful    Mood/Affect:  Anxious , Interested , Calm    CSW Assessment: CSW met with MOB to complete an assessment for THC use during pregnancy.  MOB was appeared anxious when  CSW arrived and informed CSW that MOB was expecting CSW.  MOB was inviting and interested in meeting with CSW.  CSW inquired about MOB's substance use and MOB acknowledged the use of marijuana about 3 months ago.  MOB communicated that MOB was attending a party and "followed the crowd" and used marijuana. MOB denied any other substance use.  CSW thanked MOB for [employer] and informed MOB of the hospital's drug screen policy. MOB was informed that the infant had a negative UDS. CSW made MOB aware that CSW will continue to  monitor the infant's cord and if there is a positive screen without an explanation, CSW will make a report to [place] office. CSW offered MOB resources and referrals for substance abuse; however, MOB declined all information and communicated that MOB did not have a substance abuse problem. CSW educated MOB about PPD.  CSW informed MOB of supports and interventions to decrease PPD and reviewed supports for MOB. CSW also encouraged MOB to seek medical attention if needed for increased signs and symptoms for PPD. CSW reviewed safe sleep and SIDS. MOB was knowledgeable, and asked appropriate questions.   MOB communicated that she has a crib and car seat for the baby.  CSW thanked MOB for allowing CSW to meet with MOB.   CSW Plan/Description:  No Further Intervention Required/No Barriers to Discharge, Patient/Family Education  (CSW will follow infant's cord and will make a report to [place] if warranted. )   Laurey Arrow, MSW, LCSW Clinical Social Work [phone]    Dimple Nanas, LCSW 02/01/2016, 11:09 AM

## 2016-02-04 ENCOUNTER — Telehealth: Payer: Self-pay | Admitting: Women's Health

## 2016-02-04 NOTE — Telephone Encounter (Signed)
Patient called wanting to know results of umbilical cord drug screen sample and how long it takes for results to come back. States she is concerned because Education officer, museum met with her at the hospital and stated she would follow up if drug screen was positive and the results could show positives from 4 months prior.  Patient made aware that we do not get those results. Pt encouraged to talk with pediatrician to get baby signed up for my chart to review results. Also made aware that social worker sees every patient with a drug history. No further questions from patient.

## 2016-03-03 ENCOUNTER — Encounter: Payer: Self-pay | Admitting: *Deleted

## 2017-03-27 ENCOUNTER — Other Ambulatory Visit: Payer: Self-pay | Admitting: Obstetrics and Gynecology

## 2017-03-27 ENCOUNTER — Encounter: Payer: Self-pay | Admitting: *Deleted

## 2018-01-03 ENCOUNTER — Encounter: Payer: Self-pay | Admitting: Obstetrics and Gynecology

## 2018-01-03 ENCOUNTER — Other Ambulatory Visit: Payer: Medicaid Other | Admitting: Obstetrics and Gynecology

## 2023-05-06 ENCOUNTER — Telehealth: Payer: No Typology Code available for payment source

## 2023-05-06 NOTE — Progress Notes (Signed)
 Pt did not show up for visit. DWBerr
